# Patient Record
Sex: Male | Born: 1940 | Race: White | Hispanic: No | Marital: Married | Smoking: Former smoker
Health system: Southern US, Community
[De-identification: ages and names within clinical notes are randomized; demographics above are authoritative.]

## PROBLEM LIST (undated history)

## (undated) DIAGNOSIS — C801 Malignant (primary) neoplasm, unspecified: Secondary | ICD-10-CM

## (undated) DIAGNOSIS — R51 Headache: Secondary | ICD-10-CM

## (undated) DIAGNOSIS — N189 Chronic kidney disease, unspecified: Secondary | ICD-10-CM

## (undated) DIAGNOSIS — F0280 Dementia in other diseases classified elsewhere without behavioral disturbance: Secondary | ICD-10-CM

## (undated) DIAGNOSIS — R519 Headache, unspecified: Secondary | ICD-10-CM

## (undated) DIAGNOSIS — I10 Essential (primary) hypertension: Secondary | ICD-10-CM

## (undated) HISTORY — PX: APPENDECTOMY: SHX54

## (undated) HISTORY — DX: Chronic kidney disease, unspecified: N18.9

## (undated) HISTORY — DX: Headache: R51

## (undated) HISTORY — DX: Essential (primary) hypertension: I10

## (undated) HISTORY — DX: Malignant (primary) neoplasm, unspecified: C80.1

## (undated) HISTORY — DX: Headache, unspecified: R51.9

---

## 2010-03-08 ENCOUNTER — Ambulatory Visit: Payer: Self-pay | Admitting: Family Medicine

## 2010-03-08 DIAGNOSIS — I10 Essential (primary) hypertension: Secondary | ICD-10-CM

## 2010-03-08 DIAGNOSIS — L255 Unspecified contact dermatitis due to plants, except food: Secondary | ICD-10-CM | POA: Insufficient documentation

## 2010-08-27 ENCOUNTER — Ambulatory Visit (HOSPITAL_BASED_OUTPATIENT_CLINIC_OR_DEPARTMENT_OTHER): Admission: RE | Admit: 2010-08-27 | Discharge: 2010-08-27 | Payer: Self-pay | Admitting: Podiatry

## 2010-12-25 NOTE — Assessment & Plan Note (Signed)
Summary: RASH/POSSIBLE POSION IVY   Vital Signs:  Patient Profile:   70 Years Old Male CC:      Rash to bilateral lower arms X 4 days Height:     68 inches Weight:      193 pounds O2 Sat:      97 % O2 treatment:    Room Air Temp:     97.5 degrees F oral Pulse rate:   67 / minute Resp:     14 per minute BP sitting:   121 / 81  (left arm) Cuff size:   regular  Pt. in pain?   no  Vitals Entered By: Lajean Saver RN (March 08, 2010 11:39 AM)                   Updated Prior Medication List: HYZAAR 50-12.5 MG TABS (LOSARTAN POTASSIUM-HCTZ) once daily * XYLATAN 1 gtt daily POTASSIUM 99 MG TABS (POTASSIUM) 2 once daily  Current Allergies: ! * LATEX ! * SHELLFISH    History of Present Illness Chief Complaint: Rash to bilateral lower arms X 4 days History of Present Illness: WORKING IN THE WOODS. RASH ON FOREARMS. ITCHES. USING CALAMINE AND CORTISONE CREAM. HX OF PSORIASIS. NO OTHER SYMPTOMS.  REVIEW OF SYSTEMS Constitutional Symptoms      Denies fever, chills, night sweats, weight loss, weight gain, and fatigue.  Eyes       Denies change in vision, eye pain, eye discharge, glasses, contact lenses, and eye surgery. Ear/Nose/Throat/Mouth       Denies hearing loss/aids, change in hearing, ear pain, ear discharge, dizziness, frequent runny nose, frequent nose bleeds, sinus problems, sore throat, hoarseness, and tooth pain or bleeding.  Respiratory       Denies dry cough, productive cough, wheezing, shortness of breath, asthma, bronchitis, and emphysema/COPD.  Cardiovascular       Denies murmurs, chest pain, and tires easily with exhertion.    Gastrointestinal       Denies stomach pain, nausea/vomiting, diarrhea, constipation, blood in bowel movements, and indigestion. Genitourniary       Denies painful urination, kidney stones, and loss of urinary control. Neurological       Denies paralysis, seizures, and fainting/blackouts. Musculoskeletal       Denies muscle pain,  joint pain, joint stiffness, decreased range of motion, redness, swelling, muscle weakness, and gout.  Skin       Denies bruising, unusual mles/lumps or sores, and hair/skin or nail changes.      Comments: bilateral lower arms Psych       Denies mood changes, temper/anger issues, anxiety/stress, speech problems, depression, and sleep problems. Other Comments: Rash to lower arms X 4 days   Past History:  Past Medical History: Hypertension  Past Surgical History: Appendectomy 1948 Cataract extraction right eye  Family History: Mother- DM  Social History: Occupation: Motivational speaker Married Quit smoking 50 years ago after smoking for 6 years Alcohol use-yes 1-2/month Drug use-no Regular exercise-yes Drug Use:  no Does Patient Exercise:  yes Physical Exam General appearance: well developed, well nourished, no acute distress Chest/Lungs: no rales, wheezes, or rhonchi bilateral, breath sounds equal without effort Heart: regular rate and  rhythm, no murmur Skin: RASH CONSISTANT WITH CONTACT DERMATITIS ON FORE ARMS.  Assessment New Problems: CONTACT DERMATITIS&OTHER ECZEMA DUE TO PLANTS (ICD-692.6) HYPERTENSION (ICD-401.9)   Plan New Medications/Changes: MEDROL (PAK) 4 MG TABS (METHYLPREDNISOLONE) TAKE AS DIRECTED WITH FOOD  #1 PK x 0, 03/08/2010, Marvis Moeller DO  New Orders: New Patient  Level II [99202]   Prescriptions: MEDROL (PAK) 4 MG TABS (METHYLPREDNISOLONE) TAKE AS DIRECTED WITH FOOD  #1 PK x 0   Entered and Authorized by:   Marvis Moeller DO   Signed by:   Marvis Moeller DO on 03/08/2010   Method used:   Print then Give to Patient   RxID:   [id number]   Patient Instructions: 1)  CONTINUE CORTISONE AND CALAMINE AS NEEDED. FOLLOW UP WTH YOUR PCP OR RETURN IF SYMPTOMS PERSIST OR WORSEN.

## 2011-02-07 LAB — POCT I-STAT 4, (NA,K, GLUC, HGB,HCT)
Glucose, Bld: 97 mg/dL (ref 70–99)
HCT: 41 % (ref 39.0–52.0)
Sodium: 141 mEq/L (ref 135–145)

## 2011-09-18 NOTE — Op Note (Signed)
  NAMEWILLIARD, Steven             ACCOUNT NO.:  [id number]  MEDICAL RECORD NO.:  [id number]          PATIENT TYPE:  AMB  LOCATION:  [facility]                         FACILITY:  [facility]  PHYSICIAN:  Ezequiel Kayser. Inaaya Vellucci, D.P.M.DATE OF BIRTH:  Nov 21, 1941  DATE OF PROCEDURE: DATE OF DISCHARGE:  08/27/2010                              OPERATIVE REPORT   SURGEON:  Ezequiel Kayser. Myna Freimark, DPM.  ASSISTANT:  None.  PREOPERATIVE DIAGNOSIS:  Chronic plantar fasciitis, right foot.  POSTOPERATIVE DIAGNOSIS:  Chronic plantar fasciitis, right foot.  PROCEDURE:  Plantar fascia micro fasciotomy using radiofrequency coblation.  ANESTHESIA:  LMA.  COMPLICATIONS:  None.  DESCRIPTION OF PROCEDURE:  The patient was brought to the OR and placed in the supine position, at which time LMA anesthesia was administered. A local block was performed with a 1:1 mixture of 0.5% Marcaine plain and 1% lidocaine plain.  The patient was prepped and draped in usual aseptic manner.  The foot was exsanguinated with an Esmarch bandage and a previously applied tourniquet inflated to 250 mmHg.  Attention was directed to the plantar aspect of the foot where the point of pain was identified at the medial calcaneal tuberosity region.  Using a 0.062 smooth K-wire, 20 holes were penetrated into the area in a grade fashion.  Then, using the Topaz-1 set at level 4, a radiofrequency was applied once assistance was felt through the previously made hole. Then, a 2nd application was made deeper.  Thus, 2 applications in each area were performed administering the radiofrequency coblation.  The foot was then dressed with Steri-Strips, Adaptic, 4 x 4's, Kling, and an Ace bandage.  The tourniquet was deflated and vascular status returned to all digits.  The patient was sent to the recovery room with vital signs stable and capillary refill time set at presurgical levels.  The patient was instructed to remain nonweightbearing and a pneumatic  CAM walker was dispensed.  No guarantees given.  All questions answered.          ______________________________ Ezequiel Kayser. Harriet Pho, D.P.M.     MJA/MEDQ  D:  09/11/2010  T:  09/11/2010  Job:  [id number]  Electronically Signed by Larey Dresser D.P.M. on 09/18/2011 05:55:24 PM

## 2011-10-22 ENCOUNTER — Encounter (HOSPITAL_BASED_OUTPATIENT_CLINIC_OR_DEPARTMENT_OTHER)
Admission: RE | Disposition: A | Discharge status: Home / Self Care | Payer: Self-pay | Source: Ambulatory Visit | Attending: Orthopedic Surgery

## 2011-10-22 ENCOUNTER — Ambulatory Visit (HOSPITAL_BASED_OUTPATIENT_CLINIC_OR_DEPARTMENT_OTHER)
Admission: RE | Admit: 2011-10-22 | Discharge: 2011-10-22 | Disposition: A | Discharge status: Home / Self Care | Payer: Medicare Other | Source: Ambulatory Visit | Attending: Orthopedic Surgery | Admitting: Orthopedic Surgery

## 2011-10-22 DIAGNOSIS — Y92009 Unspecified place in unspecified non-institutional (private) residence as the place of occurrence of the external cause: Secondary | ICD-10-CM | POA: Insufficient documentation

## 2011-10-22 DIAGNOSIS — L089 Local infection of the skin and subcutaneous tissue, unspecified: Secondary | ICD-10-CM | POA: Insufficient documentation

## 2011-10-22 DIAGNOSIS — X58XXXA Exposure to other specified factors, initial encounter: Secondary | ICD-10-CM | POA: Insufficient documentation

## 2011-10-22 HISTORY — PX: FOREIGN BODY REMOVAL: SHX962

## 2011-10-22 SURGERY — FOREIGN BODY REMOVAL ADULT
Anesthesia: LOCAL | Laterality: Right

## 2011-10-22 MED ORDER — CIPROFLOXACIN HCL 500 MG PO TABS: 500.0000 mg | ORAL_TABLET | Freq: Two times a day (BID) | ORAL | Status: AC

## 2011-10-22 MED ORDER — LIDOCAINE HCL 2 % IJ SOLN
INTRAMUSCULAR | Status: DC | PRN
  Administered 2011-10-22: 2 mL

## 2011-10-22 SURGICAL SUPPLY — 50 items
BANDAGE ADHESIVE 1X3 (GAUZE/BANDAGES/DRESSINGS) IMPLANT
BANDAGE COBAN STERILE 2 (GAUZE/BANDAGES/DRESSINGS) IMPLANT
BLADE MINI RND TIP GREEN BEAV (BLADE) IMPLANT
BLADE SURG 15 STRL LF DISP TIS (BLADE) ×1 IMPLANT
BLADE SURG 15 STRL SS (BLADE) ×1
BNDG COHESIVE 1X5 TAN STRL LF (GAUZE/BANDAGES/DRESSINGS) ×2 IMPLANT
BNDG ESMARK 4X9 LF (GAUZE/BANDAGES/DRESSINGS) IMPLANT
BRUSH SCRUB EZ PLAIN DRY (MISCELLANEOUS) ×2 IMPLANT
CLOTH BEACON ORANGE TIMEOUT ST (SAFETY) ×2 IMPLANT
CORDS BIPOLAR (ELECTRODE) IMPLANT
COVER MAYO STAND STRL (DRAPES) ×2 IMPLANT
COVER TABLE BACK 60X90 (DRAPES) ×2 IMPLANT
CUFF TOURNIQUET SINGLE 18IN (TOURNIQUET CUFF) IMPLANT
DECANTER SPIKE VIAL GLASS SM (MISCELLANEOUS) IMPLANT
DRAIN PENROSE 1/2X12 LTX STRL (WOUND CARE) IMPLANT
DRAIN PENROSE 1/4X12 LTX STRL (WOUND CARE) IMPLANT
DRAPE EXTREMITY T 121X128X90 (DRAPE) ×2 IMPLANT
DRAPE SURG 17X23 STRL (DRAPES) ×2 IMPLANT
DRSG EMULSION OIL 3X3 NADH (GAUZE/BANDAGES/DRESSINGS) ×2 IMPLANT
GAUZE XEROFORM 1X8 LF (GAUZE/BANDAGES/DRESSINGS) IMPLANT
GLOVE BIOGEL M STRL SZ7.5 (GLOVE) IMPLANT
GLOVE ORTHO TXT STRL SZ7.5 (GLOVE) IMPLANT
GLOVE SKINSENSE NS SZ6.5 (GLOVE) ×2
GLOVE SKINSENSE NS SZ7.5 (GLOVE) ×2
GLOVE SKINSENSE STRL SZ6.5 (GLOVE) ×2 IMPLANT
GLOVE SKINSENSE STRL SZ7.5 (GLOVE) ×2 IMPLANT
GLOVE SURG SS PI 7.0 STRL IVOR (GLOVE) ×2 IMPLANT
GOWN PREVENTION PLUS XLARGE (GOWN DISPOSABLE) ×4 IMPLANT
GOWN PREVENTION PLUS XXLARGE (GOWN DISPOSABLE) ×4 IMPLANT
NEEDLE 27GAX1X1/2 (NEEDLE) ×2 IMPLANT
NEEDLE BLUNT 17GA (NEEDLE) IMPLANT
NS IRRIG 1000ML POUR BTL (IV SOLUTION) IMPLANT
PACK BASIN DAY SURGERY FS (CUSTOM PROCEDURE TRAY) ×2 IMPLANT
PADDING CAST ABS 4INX4YD NS (CAST SUPPLIES) ×1
PADDING CAST ABS COTTON 4X4 ST (CAST SUPPLIES) ×1 IMPLANT
SPONGE GAUZE 2X2 8PLY STRL LF (GAUZE/BANDAGES/DRESSINGS) ×2 IMPLANT
SPONGE GAUZE 4X4 12PLY (GAUZE/BANDAGES/DRESSINGS) ×2 IMPLANT
STOCKINETTE 4X48 STRL (DRAPES) ×2 IMPLANT
SUT CHROMIC 6 0 PS 4 (SUTURE) ×2 IMPLANT
SUT ETHILON 5 0 P 3 18 (SUTURE) ×1
SUT MERSILENE 4 0 P 3 (SUTURE) IMPLANT
SUT NYLON ETHILON 5-0 P-3 1X18 (SUTURE) ×1 IMPLANT
SUT PROLENE 3 0 PS 2 (SUTURE) IMPLANT
SYR 20CC LL (SYRINGE) IMPLANT
SYR 3ML 23GX1 SAFETY (SYRINGE) IMPLANT
SYR CONTROL 10ML LL (SYRINGE) ×2 IMPLANT
TOWEL OR 17X24 6PK STRL BLUE (TOWEL DISPOSABLE) ×4 IMPLANT
TRAY DSU PREP LF (CUSTOM PROCEDURE TRAY) ×2 IMPLANT
UNDERPAD 30X30 INCONTINENT (UNDERPADS AND DIAPERS) ×2 IMPLANT
WATER STERILE IRR 1000ML POUR (IV SOLUTION) ×2 IMPLANT

## 2011-10-22 NOTE — Op Note (Signed)
Dictated 10/22/11 CSN:  [id number] MRN:  [id number] Steven Orozco,Steven Orozco

## 2011-10-22 NOTE — Brief Op Note (Signed)
10/22/2011  1:38 PM  PATIENT:  Steven Orozco  70 y.o. male  PRE-OPERATIVE DIAGNOSIS:  foreign body right index finger  POST-OPERATIVE DIAGNOSIS:  Cedar fragment left index finger  PROCEDURE:  Procedure(s): FOREIGN BODY REMOVAL ADULT middle phalangeal segment of left index finger  SURGEON:  Surgeon(s): Wyn Forster., MD  PHYSICIAN ASSISTANT:   ASSISTANTS: Annye Rusk, P.A.-C   ANESTHESIA:   2% lidocaine 2.5 cc digital block  EBL:     BLOOD ADMINISTERED:none  DRAINS: none   LOCAL MEDICATIONS USED:  XYLOCAINE 2.5 CC  SPECIMEN:  Source of Specimen:  wood foreign body  DISPOSITION OF SPECIMEN:  N/A  COUNTS:  YES  TOURNIQUET:  * Missing tourniquet times found for documented tourniquets in log:  [id number] *  DICTATION: .Other Dictation: Dictation Number [id number]  PLAN OF CARE: Discharge to home after PACU  PATIENT DISPOSITION:  PACU - hemodynamically stable.

## 2011-10-23 ENCOUNTER — Encounter (HOSPITAL_BASED_OUTPATIENT_CLINIC_OR_DEPARTMENT_OTHER): Payer: Self-pay

## 2011-10-23 NOTE — Op Note (Signed)
NAME:  CARVER, MURAKAMI                  ACCOUNT NO.:  MEDICAL RECORD NO.:  [id number]  LOCATION:                                 FACILITY:  PHYSICIAN:  Katy Fitch. Aydon Swamy, M.D.      DATE OF BIRTH:  DATE OF PROCEDURE:  10/22/2011 DATE OF DISCHARGE:                              OPERATIVE REPORT   PREOPERATIVE DIAGNOSIS:  Cedar splinter wood foreign body, left index finger, middle phalangeal segment volar surface.  POSTOPERATIVE DIAGNOSIS:  Cedar splinter wood foreign body, left index finger, middle phalangeal segment volar surface with identification of abscess on the ulnar aspect of middle phalangeal segment, left index finger.  OPERATION:  Removal of Cedar wood foreign body from left index finger, with drainage of abscess.  OPERATING SURGEON:  Katy Fitch. Medina Degraffenreid, MD  ASSISTANT:  Marveen Reeks Dasnoit, PA-C  ANESTHESIA:  Lidocaine 2% metacarpal head level block without sedation, this was a minor operating room procedure.  INDICATIONS:  Ola Fawver is a 70 year old gentleman referred through the courtesy of Dr. Kirby Funk for evaluation and management of a wood foreign body.  He accidentally impaled his finger on October 14, 2011.  There was a neighbor to remove the splinter.  He began to develop swelling and pain.  He is referred for Hand Surgery consult and was advised to pursue incision and drainage for splinter removal and drainage of his abscess.  Questions regarding anticipated procedure were invited and answered in the holding area.  PROCEDURE:  Ashtin Rosner was brought to room 2 at the [facility] and placed in supine position upon the operating table.  Following informed consent and Betadine prep, 2.5 mL of 2% lidocaine were infiltrated at metacarpal head level and in the flexor sheath to obtain a digital block.  After 10 minutes excellent anesthesia was achieved.  The left hand and arm were then prepped with Betadine soap and solution and sterilely  draped.  The finger was then exsanguinated with a gauze wrap and a 0.5 inch Penrose drain placed over the proximal phalangeal segment of the fingers with digital tourniquet.  After routine surgical time-out, procedure commenced with a 1 cm transverse incision directly over the mass.  Subcutaneous tissues were carefully divided, taking care to avoid the positions of the proper digital nerves.  Abscess cavity was identified and this was split and purulent material recovered.  The wooden foreign fragment was then expressed by digital palpation and pressure.  This was removed and shown to Mr. Red.  The wound was then thoroughly explored for other foreign body material and none was identified.  After complete drainage of the abscess, a single 6-0 chromic suture was used to loosely close the wound.  The finger was then dressed with Adaptic, sterile gauze, and Coban anchored at the wrist level.  For aftercare, Mr. Tapp is going to be provided prescription for Cipro 500 mg 1 p.o. b.i.d. x4 days as a prophylactic antibiotic.     Katy Fitch Gustin Zobrist, M.D.     RVS/MEDQ  D:  10/22/2011  T:  10/22/2011  Job:  [id number]  cc:   Thora Lance, M.D.

## 2011-10-24 ENCOUNTER — Encounter (HOSPITAL_BASED_OUTPATIENT_CLINIC_OR_DEPARTMENT_OTHER): Payer: Self-pay | Admitting: Orthopedic Surgery

## 2013-05-06 ENCOUNTER — Other Ambulatory Visit: Payer: Self-pay | Admitting: Internal Medicine

## 2013-05-06 DIAGNOSIS — R413 Other amnesia: Secondary | ICD-10-CM

## 2013-05-08 ENCOUNTER — Ambulatory Visit
Admission: RE | Admit: 2013-05-08 | Discharge: 2013-05-08 | Disposition: A | Discharge status: Home / Self Care | Payer: Medicare Other | Source: Ambulatory Visit | Attending: Internal Medicine | Admitting: Internal Medicine

## 2013-05-08 DIAGNOSIS — R413 Other amnesia: Secondary | ICD-10-CM

## 2013-05-15 ENCOUNTER — Ambulatory Visit
Admission: RE | Admit: 2013-05-15 | Discharge: 2013-05-15 | Disposition: A | Discharge status: Home / Self Care | Payer: Medicare Other | Source: Ambulatory Visit | Attending: Internal Medicine | Admitting: Internal Medicine

## 2013-10-12 ENCOUNTER — Other Ambulatory Visit: Payer: Self-pay | Admitting: Otolaryngology

## 2013-10-12 DIAGNOSIS — R1310 Dysphagia, unspecified: Secondary | ICD-10-CM

## 2013-10-12 DIAGNOSIS — K219 Gastro-esophageal reflux disease without esophagitis: Secondary | ICD-10-CM

## 2013-10-15 ENCOUNTER — Ambulatory Visit
Admission: RE | Admit: 2013-10-15 | Discharge: 2013-10-15 | Disposition: A | Discharge status: Home / Self Care | Payer: Medicare Other | Source: Ambulatory Visit | Attending: Otolaryngology | Admitting: Otolaryngology

## 2013-10-15 DIAGNOSIS — K219 Gastro-esophageal reflux disease without esophagitis: Secondary | ICD-10-CM

## 2013-10-15 DIAGNOSIS — R1310 Dysphagia, unspecified: Secondary | ICD-10-CM

## 2014-11-09 ENCOUNTER — Other Ambulatory Visit: Payer: Self-pay | Admitting: Internal Medicine

## 2014-11-09 DIAGNOSIS — R519 Headache, unspecified: Secondary | ICD-10-CM

## 2014-11-09 DIAGNOSIS — R413 Other amnesia: Secondary | ICD-10-CM

## 2014-11-09 DIAGNOSIS — R51 Headache: Principal | ICD-10-CM

## 2014-11-10 ENCOUNTER — Ambulatory Visit
Admission: RE | Admit: 2014-11-10 | Discharge: 2014-11-10 | Disposition: A | Discharge status: Home / Self Care | Payer: Medicare Other | Source: Ambulatory Visit | Attending: Internal Medicine | Admitting: Internal Medicine

## 2014-11-10 DIAGNOSIS — R519 Headache, unspecified: Secondary | ICD-10-CM

## 2014-11-10 DIAGNOSIS — R413 Other amnesia: Secondary | ICD-10-CM

## 2014-11-10 DIAGNOSIS — R51 Headache: Principal | ICD-10-CM

## 2014-11-10 MED ORDER — IOHEXOL 300 MG/ML  SOLN
75.0000 mL | Freq: Once | INTRAMUSCULAR | Status: AC | PRN
  Administered 2014-11-10: 75 mL via INTRAVENOUS

## 2015-01-17 ENCOUNTER — Other Ambulatory Visit: Payer: Self-pay | Admitting: Internal Medicine

## 2015-01-17 DIAGNOSIS — R1013 Epigastric pain: Secondary | ICD-10-CM

## 2015-01-20 ENCOUNTER — Ambulatory Visit
Admission: RE | Admit: 2015-01-20 | Discharge: 2015-01-20 | Disposition: A | Discharge status: Home / Self Care | Payer: Medicare Other | Source: Ambulatory Visit | Attending: Internal Medicine | Admitting: Internal Medicine

## 2015-01-20 DIAGNOSIS — R1013 Epigastric pain: Secondary | ICD-10-CM

## 2015-01-20 MED ORDER — IOPAMIDOL (ISOVUE-300) INJECTION 61%
100.0000 mL | Freq: Once | INTRAVENOUS | Status: AC | PRN
  Administered 2015-01-20: 100 mL via INTRAVENOUS

## 2015-04-10 ENCOUNTER — Ambulatory Visit
Admission: RE | Admit: 2015-04-10 | Discharge: 2015-04-10 | Disposition: A | Discharge status: Home / Self Care | Payer: Medicare Other | Source: Ambulatory Visit | Attending: Internal Medicine | Admitting: Internal Medicine

## 2015-04-10 ENCOUNTER — Other Ambulatory Visit: Payer: Self-pay | Admitting: Internal Medicine

## 2015-04-10 DIAGNOSIS — M545 Low back pain: Secondary | ICD-10-CM

## 2015-05-15 ENCOUNTER — Encounter: Payer: Self-pay | Admitting: Neurology

## 2015-05-15 ENCOUNTER — Ambulatory Visit (INDEPENDENT_AMBULATORY_CARE_PROVIDER_SITE_OTHER): Payer: Medicare Other | Admitting: Neurology

## 2015-05-15 VITALS — BP 140/78 | HR 78 | Resp 18 | Ht 68.0 in | Wt 175.9 lb

## 2015-05-15 DIAGNOSIS — R51 Headache: Secondary | ICD-10-CM

## 2015-05-15 DIAGNOSIS — G609 Hereditary and idiopathic neuropathy, unspecified: Secondary | ICD-10-CM | POA: Diagnosis not present

## 2015-05-15 DIAGNOSIS — G4486 Cervicogenic headache: Secondary | ICD-10-CM

## 2015-05-15 DIAGNOSIS — R413 Other amnesia: Secondary | ICD-10-CM | POA: Insufficient documentation

## 2015-05-15 NOTE — Patient Instructions (Signed)
1.  We will send you for neuropsychological testing at [facility] 2.  We will get notes from Dr. Krista Blue at [facility] 3.  Try samples of Lyrica 75mg  twice daily for nerve pain. 4.  Limit use of Tylenol to no more than 2 days out of the week 5.  Follow up after neuropsychological testing.

## 2015-05-15 NOTE — Progress Notes (Signed)
NEUROLOGY CONSULTATION NOTE  Steven Orozco MRN: [id number] DOB: December 16, 1940  Referring provider: Dr. Laurann Montana Primary care provider: Dr. Laurann Montana  Reason for consult:  Memory problems, neuropathy, headache  HISTORY OF PRESENT ILLNESS: Steven Orozco is a 74 year old right-handed man with hypertension and neuropathy who presents for memory problems, neuropathy and headache.  Records, labs, MRI of brain and CT of head reviewed.  Memory: He began having memory problems at least 2 years ago.  He notes that he would incorrectly enter appointments on the calendar.  Sometimes, when he drives, he feels confused.  However, he reports examples such as not seeing street signs rather than disorientation on familiar routes.  He sometimes has trouble recalling familiar faces from church, whom he has seen for 3 years.  He has no problems with close friends or family.  He denies problems recognizing people.  He is able to perform all activities of daily living.  His wife is the one who always paid the bills.  He denies depression.  Sleep is variable.  He lives with his wife.  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  He denies family history of dementia.  Neuropathy:  He reports having shooting pain in his right foot about 5-6 years ago following surgery for plantar fasciitis.  He soon developed pain as well as numbness in both feet.  He had a NCV-EMG which reportedly revealed neuropathy.  He tried gabapentin, which caused dizziness.  The pain prevents him from going on walks.  Headache: He started having headaches in December.  On three occasions, he had severe bi-temporal and suboccipital aching headache, lasting no longer than an hour.  He also will have a dull diffuse headache from time to time.  He takes Tylenol several times a week.  TSH was 2.07.  B12 was 1494.    He had an MRI of the brain without contrast performed on 05/15/13, which showed atrophy and chronic small vessel  ischemic changes.  A more recent CT of the head from 11/10/14 looked stable compared to prior MRI.  PAST MEDICAL HISTORY: Past Medical History  Diagnosis Date  . Cancer   . Headache   . Hypertension   . Chronic kidney disease     PAST SURGICAL HISTORY: Past Surgical History  Procedure Laterality Date  . Foreign body removal  10/22/2011    Procedure: FOREIGN BODY REMOVAL ADULT;  Surgeon: Cammie Sickle., MD;  Location: [place];  Service: Orthopedics;  Laterality: Right;  right index   . Appendectomy      MEDICATIONS: No current outpatient prescriptions on file prior to visit.   No current facility-administered medications on file prior to visit.    ALLERGIES: Allergies  Allergen Reactions  . Milk-Related Compounds Itching and Swelling  . Doxycycline Itching    Stomach pain  . Latex Other (See Comments)    Blisters on hands and feet  . Prednisone Other (See Comments)    cramping  . Shellfish-Derived Products Itching    FAMILY HISTORY: Family History  Problem Relation Age of Onset  . Cancer Mother     uterine  . Cancer Sister     breast  . Parkinson's disease Maternal Grandmother     SOCIAL HISTORY: History   Social History  . Marital Status: Married    Spouse Name: N/A  . Number of Children: N/A  . Years of Education: N/A   Occupational History  . Not on file.  Social History Main Topics  . Smoking status: Former Research scientist (life sciences)  . Smokeless tobacco: Never Used  . Alcohol Use: 0.0 oz/week    0 Standard drinks or equivalent per week     Comment: rare  . Drug Use: No  . Sexual Activity: No   Other Topics Concern  . Not on file   Social History Narrative    REVIEW OF SYSTEMS: Constitutional: No fevers, chills, or sweats, no generalized fatigue, change in appetite Eyes: No visual changes, double vision, eye pain Ear, nose and throat: No hearing loss, ear pain, nasal congestion, sore throat Cardiovascular: No chest pain,  palpitations Respiratory:  No shortness of breath at rest or with exertion, wheezes GastrointestinaI: No nausea, vomiting, diarrhea, abdominal pain, fecal incontinence Genitourinary:  No dysuria, urinary retention or frequency Musculoskeletal:  No neck pain, back pain Integumentary: No rash, pruritus, skin lesions Neurological: as above Psychiatric: No depression, insomnia, anxiety Endocrine: No palpitations, fatigue, diaphoresis, mood swings, change in appetite, change in weight, increased thirst Hematologic/Lymphatic:  No anemia, purpura, petechiae. Allergic/Immunologic: no itchy/runny eyes, nasal congestion, recent allergic reactions, rashes  PHYSICAL EXAM: Filed Vitals:   05/15/15 1014  BP: 140/78  Pulse: 78  Resp: 18   General: No acute distress Head:  Normocephalic/atraumatic Eyes:  fundi unremarkable, without vessel changes, exudates, hemorrhages or papilledema. Neck: supple, no paraspinal tenderness, full range of motion Back: No paraspinal tenderness Heart: regular rate and rhythm Lungs: Clear to auscultation bilaterally. Vascular: No carotid bruits. Neurological Exam: Mental status: alert and oriented to person, place, and time, delayed recall poor, remote memory intact, fund of knowledge intact, attention and concentration intact, speech fluent and not dysarthric, language intact. Montreal Cognitive Assessment  05/15/2015  Visuospatial/ Executive (0/5) 2  Naming (0/3) 3  Attention: Read list of digits (0/2) 2  Attention: Read list of letters (0/1) 1  Attention: Serial 7 subtraction starting at 100 (0/3) 3  Language: Repeat phrase (0/2) 2  Language : Fluency (0/1) 1  Abstraction (0/2) 2  Delayed Recall (0/5) 0  Orientation (0/6) 4  Total 20  Adjusted Score (based on education) 20   Cranial nerves: CN I: not tested CN II: pupils equal, round and reactive to light, visual fields intact, fundi unremarkable, without vessel changes, exudates, hemorrhages or  papilledema. CN III, IV, VI:  full range of motion, no nystagmus, no ptosis CN V: facial sensation intact CN VII: upper and lower face symmetric CN VIII: hearing intact CN IX, X: gag intact, uvula midline CN XI: sternocleidomastoid and trapezius muscles intact CN XII: tongue midline Bulk & Tone: normal, no fasciculations. Motor:  5/5 throughout Sensation:  Pinprick and vibration intact Deep Tendon Reflexes:  2+ throughout, toes downgoing Finger to nose testing:  No dysmetria Heel to shin:  No dysmetria Gait:  Mildly cautious gait.  Able to turn and tandem walk. Romberg negative.  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 Possible cervicogenic headache.  PLAN: Refer for neuropsychological testing Lyrica 75mg  twice daily Limit use of pain relievers (Tylenol) to no more than 2 days out of the week Follow up after testing. Have blood pressure rechecked by PCP  Thank you for allowing me to take part in the care of this patient.  Metta Clines, DO  CC:  Lavone Orn, MD

## 2015-05-30 ENCOUNTER — Ambulatory Visit: Payer: Medicare Other | Admitting: Neurology

## 2015-06-21 ENCOUNTER — Telehealth: Payer: Self-pay | Admitting: Neurology

## 2015-06-21 NOTE — Telephone Encounter (Signed)
Terry from [phone]/ needs to know what they are to do for Steven Orozco/DOB: [birth year]

## 2015-06-21 NOTE — Telephone Encounter (Signed)
I spoke with  Steven Orozco this patient is already seeing [facility] as requested no referral was sent to Behavioral health

## 2015-07-11 ENCOUNTER — Ambulatory Visit (INDEPENDENT_AMBULATORY_CARE_PROVIDER_SITE_OTHER): Payer: Medicare Other | Admitting: Neurology

## 2015-07-11 ENCOUNTER — Encounter: Payer: Self-pay | Admitting: Neurology

## 2015-07-11 VITALS — BP 134/82 | HR 64 | Resp 16 | Ht 68.5 in | Wt 176.4 lb

## 2015-07-11 DIAGNOSIS — I679 Cerebrovascular disease, unspecified: Secondary | ICD-10-CM | POA: Diagnosis not present

## 2015-07-11 DIAGNOSIS — G3184 Mild cognitive impairment, so stated: Secondary | ICD-10-CM | POA: Insufficient documentation

## 2015-07-11 DIAGNOSIS — G609 Hereditary and idiopathic neuropathy, unspecified: Secondary | ICD-10-CM

## 2015-07-11 MED ORDER — DONEPEZIL HCL 5 MG PO TABS: 5.0000 mg | ORAL_TABLET | Freq: Every day | ORAL | Status: DC

## 2015-07-11 NOTE — Progress Notes (Signed)
NEUROLOGY FOLLOW UP OFFICE NOTE  Steven Orozco [id number]  HISTORY OF PRESENT ILLNESS: Steven Orozco is a 74 year old right-handed man with hypertension and neuropathy and former smoker who presents for memory problems, neuropathy and headache.  Neurocognitive testing and assessment reviewed.  He is accompanied by his wife, who provides some history.  MEMORY: He began having memory problems at least 2 years ago.  He notes that he would incorrectly enter appointments on the calendar.  Sometimes, when he drives, he feels confused.  However, he reports examples such as not seeing street signs rather than disorientation on familiar routes.  He sometimes has trouble recalling familiar faces from church, whom he has seen for 3 years.  He has no problems with close friends or family.  He denies problems recognizing people.  He is able to perform all activities of daily living.  His wife is the one who always paid the bills.  He denies depression.  Sleep is variable.  He lives with his wife.  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  He denies family history of dementia.  Update:  He underwent neurocognitive testing on 06/19/15, which revealed multi-domain mild cognitive impairment, as demonstrated by deficits in processing speed, executive function, memory retrieval, and visual form discrimination.  Etiology is likely cerebrovascular.  He also exhibited adjustment disorder with mixed anxiety and mild depressed mood.  Neuropathy:   He reports having shooting pain in his right foot about 5-6 years ago following surgery for plantar fasciitis.  He soon developed pain as well as numbness in both feet.  He had a NCV-EMG which reportedly revealed neuropathy.  He tried gabapentin, which caused dizziness.  The pain prevents him from going on walks.  Headache: He started having headaches in December.  On three occasions, he had severe bi-temporal and suboccipital aching headache, lasting no  longer than an hour.  He also will have a dull diffuse headache from time to time.  He takes Tylenol several times a week.  TSH was 2.07.  B12 was 1494.    He had an MRI of the brain without contrast performed on 05/15/13, which showed atrophy and chronic small vessel ischemic changes.  A more recent CT of the head from 11/10/14 looked stable compared to prior MRI.  PAST MEDICAL HISTORY: Past Medical History  Diagnosis Date  . Cancer   . Headache   . Hypertension   . Chronic kidney disease     MEDICATIONS: Current Outpatient Prescriptions on File Prior to Visit  Medication Sig Dispense Refill  . acetaminophen (TYLENOL) 325 MG tablet Take 650 mg by mouth every 6 (six) hours as needed.    . latanoprost (XALATAN) 0.005 % ophthalmic solution INSTILL 1 DROP INTO BOTH EYES EVERY NIGHT AT BEDTIME  5   No current facility-administered medications on file prior to visit.    ALLERGIES: Allergies  Allergen Reactions  . Milk-Related Compounds Itching and Swelling  . Doxycycline Itching    Stomach pain  . Latex Other (See Comments) and Dermatitis    Blisters on hands and feet  . Prednisone Other (See Comments)    cramping  . Shellfish-Derived Products Itching    FAMILY HISTORY: Family History  Problem Relation Age of Onset  . Cancer Mother     uterine  . Cancer Sister     breast  . Parkinson's disease Maternal Grandmother     SOCIAL HISTORY: Social History   Social History  . Marital Status:  Married    Spouse Name: N/A  . Number of Children: N/A  . Years of Education: N/A   Occupational History  . Not on file.   Social History Main Topics  . Smoking status: Former Research scientist (life sciences)  . Smokeless tobacco: Never Used  . Alcohol Use: 0.0 oz/week    0 Standard drinks or equivalent per week     Comment: rare  . Drug Use: No  . Sexual Activity: No   Other Topics Concern  . Not on file   Social History Narrative    REVIEW OF SYSTEMS: Constitutional: No fevers, chills, or  sweats, no generalized fatigue, change in appetite Eyes: No visual changes, double vision, eye pain Ear, nose and throat: No hearing loss, ear pain, nasal congestion, sore throat Cardiovascular: No chest pain, palpitations Respiratory:  No shortness of breath at rest or with exertion, wheezes GastrointestinaI: No nausea, vomiting, diarrhea, abdominal pain, fecal incontinence Genitourinary:  No dysuria, urinary retention or frequency Musculoskeletal:  No neck pain, back pain Integumentary: No rash, pruritus, skin lesions Neurological: as above Psychiatric: No depression, insomnia, anxiety Endocrine: No palpitations, fatigue, diaphoresis, mood swings, change in appetite, change in weight, increased thirst Hematologic/Lymphatic:  No anemia, purpura, petechiae. Allergic/Immunologic: no itchy/runny eyes, nasal congestion, recent allergic reactions, rashes  PHYSICAL EXAM: Filed Vitals:   07/11/15 0944  BP: 134/82  Pulse: 64  Resp: 16   General: No acute distress.  Patient appears well-groomed.   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  He previously was on a statin but stopped due to possible side effects Results raised concerns regarding is ability to safely operate a motor vehicle.  Therefore, he should undergo formal driving assessment.  He should not drive until test assessment.  At this point, he says he will let his wife do all the driving. Discussed possibility of starting an SSRI, but he declines. Lyrica 75mg  twice daily  27 minutes spent face to face with patient, 100% spent discussing neurocognitive testing results and  management.  Metta Clines, DO  CC:  Lavone Orn, MD

## 2015-07-11 NOTE — Patient Instructions (Signed)
1.  Start aspirin 81mg  daily 2.  We will start donepezil (Aricept) 5mg  daily for four weeks.  If you are tolerating the medication, then after four weeks, we will increase the dose to 10mg  daily.  Side effects include nausea, vomiting, diarrhea, vivid dreams, and muscle cramps.  Please call the clinic if you experience any of these symptoms. 3.  No driving 4.  Follow up in 6 months.

## 2015-07-14 ENCOUNTER — Other Ambulatory Visit: Payer: Self-pay | Admitting: Internal Medicine

## 2015-07-14 DIAGNOSIS — M545 Low back pain: Secondary | ICD-10-CM

## 2015-07-18 ENCOUNTER — Other Ambulatory Visit: Payer: Medicare Other

## 2015-07-19 ENCOUNTER — Other Ambulatory Visit: Payer: Medicare Other

## 2015-07-21 ENCOUNTER — Ambulatory Visit
Admission: RE | Admit: 2015-07-21 | Discharge: 2015-07-21 | Disposition: A | Discharge status: Home / Self Care | Payer: Medicare Other | Source: Ambulatory Visit | Attending: Internal Medicine | Admitting: Internal Medicine

## 2015-07-21 DIAGNOSIS — M545 Low back pain: Secondary | ICD-10-CM

## 2015-08-07 ENCOUNTER — Other Ambulatory Visit: Payer: Self-pay | Admitting: Neurology

## 2015-08-11 ENCOUNTER — Telehealth: Payer: Self-pay | Admitting: Neurology

## 2015-08-11 NOTE — Telephone Encounter (Signed)
I would have him stop the Aricept immediately.  With the dark stool, one must consider GI bleed.  I want him to monitor his stool and if it is still dark by Monday or Tuesday, then he should contact his PCP.  If he becomes weak or lightheaded during the weekend, he should probably be seen in the ED.

## 2015-08-11 NOTE — Telephone Encounter (Signed)
Patient made aware of advise from Dr Tomi Likens. He agrees with this plan. He will call us about possible change in medication once these symptoms are over.

## 2015-08-11 NOTE — Telephone Encounter (Signed)
Pt states that he is on dohepezil 5 mg and it is causing some side effects such as dark stools muscle cramps and upset stomach please call [phone]

## 2015-08-11 NOTE — Telephone Encounter (Signed)
Spoke with Steven Orozco. He states that aricept is the only new medication he has started and is having dark stools, muscle cramps and upset stomach after he takes the medication. I told him I would ask for your opinion but, to not take anymore of the medication until he hears back from our office.

## 2015-11-14 ENCOUNTER — Other Ambulatory Visit: Payer: Self-pay | Admitting: Gastroenterology

## 2015-11-14 DIAGNOSIS — R1013 Epigastric pain: Secondary | ICD-10-CM

## 2015-11-21 ENCOUNTER — Ambulatory Visit
Admission: RE | Admit: 2015-11-21 | Discharge: 2015-11-21 | Disposition: A | Discharge status: Home / Self Care | Payer: Medicare Other | Source: Ambulatory Visit | Attending: Gastroenterology | Admitting: Gastroenterology

## 2015-11-21 DIAGNOSIS — R1013 Epigastric pain: Secondary | ICD-10-CM

## 2015-11-23 ENCOUNTER — Other Ambulatory Visit: Payer: Medicare Other

## 2016-01-12 ENCOUNTER — Ambulatory Visit: Payer: Medicare Other | Admitting: Neurology

## 2016-01-15 ENCOUNTER — Ambulatory Visit (INDEPENDENT_AMBULATORY_CARE_PROVIDER_SITE_OTHER): Payer: Medicare Other | Admitting: Neurology

## 2016-01-15 ENCOUNTER — Encounter: Payer: Self-pay | Admitting: Neurology

## 2016-01-15 VITALS — BP 136/78 | HR 82 | Ht 68.5 in | Wt 173.0 lb

## 2016-01-15 DIAGNOSIS — G44219 Episodic tension-type headache, not intractable: Secondary | ICD-10-CM

## 2016-01-15 DIAGNOSIS — I10 Essential (primary) hypertension: Secondary | ICD-10-CM

## 2016-01-15 DIAGNOSIS — G609 Hereditary and idiopathic neuropathy, unspecified: Secondary | ICD-10-CM

## 2016-01-15 DIAGNOSIS — I679 Cerebrovascular disease, unspecified: Secondary | ICD-10-CM

## 2016-01-15 DIAGNOSIS — G3184 Mild cognitive impairment, so stated: Secondary | ICD-10-CM

## 2016-01-15 DIAGNOSIS — R0789 Other chest pain: Secondary | ICD-10-CM

## 2016-01-15 MED ORDER — DULOXETINE HCL 30 MG PO CPEP: 30.0000 mg | ORAL_CAPSULE | Freq: Every day | ORAL | Status: DC

## 2016-01-15 MED ORDER — TIZANIDINE HCL 2 MG PO TABS: 2.0000 mg | ORAL_TABLET | Freq: Three times a day (TID) | ORAL | Status: DC | PRN

## 2016-01-15 MED ORDER — GABAPENTIN 100 MG PO CAPS: ORAL_CAPSULE | ORAL | Status: DC

## 2016-01-15 NOTE — Progress Notes (Signed)
Chart forwarded.  

## 2016-01-15 NOTE — Progress Notes (Addendum)
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  History obtained by patient and his wife.  Neuropsych evaluation reviewed.  MEMORY: Update:  he was started on Aricept, as well as ASA 81mg  daily.  In September, he reported increased abdominal pain and dark stool.  He was advised to stop both Aricept and ASA and be checked out for upper GI bleed.  He reportedly saw his gastroenterologist.  I do not have his notes, but apparently he did CT and MRI and bleed was ruled out.  He now reports that the stool wasn't really dark but more "orange".  He still has abdominal discomfort occasionally.  He no longer drives as his wife reports concerns with depth perception and slowed reaction time.  He is able to perform his activities of daily living.  History:  He began having memory problems at least 2 years ago.  He notes that he would incorrectly enter appointments on the calendar.  Sometimes, when he drives, he feels confused.  However, he reports examples such as not seeing street signs rather than disorientation on familiar routes.  He sometimes has trouble recalling familiar faces from church, whom he has seen for 3 years.  He has no problems with close friends or family.  He denies problems recognizing people.  He is able to perform all activities of daily living.  His wife is the one who always paid the bills.  He denies depression.  Sleep is variable.  He lives with his wife.  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  He denies family history of dementia. He underwent neurocognitive testing on 06/19/15, which revealed multi-domain mild cognitive impairment, as demonstrated by deficits in processing speed, executive function, memory retrieval, and visual form discrimination.  Etiology is likely  cerebrovascular.  He also exhibited adjustment disorder with mixed anxiety and mild depressed mood.  Neuropathy:   Update:  he was started on Lyrica 75mg  twice daily.  He was given samples but never contacted [place] for refills.  He still has pain in the feet but back pain has improved with seeing a chiropractor.  History:  He reports having shooting pain in his right foot about 5-6 years ago following surgery for plantar fasciitis.  He soon developed pain as well as numbness in both feet.  He had a NCV-EMG which reportedly revealed neuropathy.  He tried gabapentin, which caused dizziness.  The pain prevents him from going on walks.  TSH and B12 were normal.    Headache: He started having headaches at the end of 2015.  On three occasions, he had severe bi-temporal and suboccipital aching headache, lasting no longer than an hour.  He also will have a dull diffuse headache from time to time.  He still has temporal headaches without nausea, photophobia or phonophobia, about one to three times a week.  He takes Tylenol.  He is unable to tolerate NSAIDs.  He had an MRI of the brain without contrast performed on 05/15/13, which showed atrophy and chronic small vessel ischemic changes.  A more recent CT of the head from 11/10/14 looked stable compared to prior MRI.  He also reports chest pain for past couple of weeks.  PAST MEDICAL HISTORY: Past Medical History  Diagnosis Date  . Cancer ([place])   . Headache   .  Hypertension   . Chronic kidney disease     MEDICATIONS: Current Outpatient Prescriptions on File Prior to Visit  Medication Sig Dispense Refill  . acetaminophen (TYLENOL) 325 MG tablet Take 650 mg by mouth every 6 (six) hours as needed.    . latanoprost (XALATAN) 0.005 % ophthalmic solution INSTILL 1 DROP INTO BOTH EYES EVERY NIGHT AT BEDTIME  5  . donepezil (ARICEPT) 5 MG tablet TAKE 1 TABLET BY MOUTH AT BEDTIME (Patient not taking: Reported on 01/15/2016) 30 tablet 5   No current  facility-administered medications on file prior to visit.    ALLERGIES: Allergies  Allergen Reactions  . Milk-Related Compounds Itching and Swelling  . Doxycycline Itching    Stomach pain  . Latex Other (See Comments) and Dermatitis    Blisters on hands and feet  . Prednisone Other (See Comments)    cramping  . Shellfish-Derived Products Itching    FAMILY HISTORY: Family History  Problem Relation Age of Onset  . Cancer Mother     uterine  . Cancer Sister     breast  . Parkinson's disease Maternal Grandmother     SOCIAL HISTORY: Social History   Social History  . Marital Status: Married    Spouse Name: N/A  . Number of Children: N/A  . Years of Education: N/A   Occupational History  . Not on file.   Social History Main Topics  . Smoking status: Former Research scientist (life sciences)  . Smokeless tobacco: Never Used  . Alcohol Use: 0.0 oz/week    0 Standard drinks or equivalent per week     Comment: rare  . Drug Use: No  . Sexual Activity: No   Other Topics Concern  . Not on file   Social History Narrative   Ph.D.    REVIEW OF SYSTEMS: Constitutional: No fevers, chills, or sweats, no generalized fatigue, change in appetite Eyes: No visual changes, double vision, eye pain Ear, nose and throat: No hearing loss, ear pain, nasal congestion, sore throat Cardiovascular: No palpitations.  Some chest discomfort Respiratory:  No shortness of breath at rest or with exertion, wheezes GastrointestinaI: No nausea, vomiting, diarrhea, abdominal pain, fecal incontinence Genitourinary:  No dysuria, urinary retention or frequency Musculoskeletal:  No neck pain, back pain Integumentary: No rash, pruritus, skin lesions Neurological: as above Psychiatric: No depression, insomnia, anxiety Endocrine: No palpitations, fatigue, diaphoresis, mood swings, change in appetite, change in weight, increased thirst Hematologic/Lymphatic:  No anemia, purpura, petechiae. Allergic/Immunologic: no itchy/runny  eyes, nasal congestion, recent allergic reactions, rashes  PHYSICAL EXAM: Filed Vitals:   01/15/16 0930  BP: 136/78  Pulse: 82   General: No acute distress.  Patient appears well-groomed.  normal body habitus. Head:  Normocephalic/atraumatic Eyes:  Fundoscopic exam unremarkable without vessel changes, exudates, hemorrhages or papilledema. Neck: supple, no paraspinal tenderness, full range of motion Heart:  Regular rate and rhythm Lungs:  Clear to auscultation bilaterally Back: No paraspinal tenderness Neurological Exam: alert and oriented to person, place, and time. Attention span and concentration intact, recent and remote memory intact, fund of knowledge intact.  Speech fluent and not dysarthric, language intact.   Montreal Cognitive Assessment  01/15/2016 05/15/2015  Visuospatial/ Executive (0/5) 0 2  Naming (0/3) 3 3  Attention: Read list of digits (0/2) 2 2  Attention: Read list of letters (0/1) 1 1  Attention: Serial 7 subtraction starting at 100 (0/3) 2 3  Language: Repeat phrase (0/2) 2 2  Language : Fluency (0/1) 1 1  Abstraction (0/2) 2 2  Delayed Recall (0/5) 0 0  Orientation (0/6) 2 4  Total 15 20  Adjusted Score (based on education) 15 20    CN II-XII intact. Fundoscopic exam unremarkable without vessel changes, exudates, hemorrhages or papilledema.  Bulk and tone normal, muscle strength 5/5 throughout.  Sensation to light touch, temperature and vibration intact.  Deep tendon reflexes 2+ throughout, toes downgoing.  Finger to nose and heel to shin testing intact.  Gait normal, Romberg negative.  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  Will obtain notes from GI to verify no evidence of GI bleed.  If so, then would try restarting Aricept and ASA 81mg  daily  ADDENDUM:  I reviewed the notes from his gastroenterologist.  EGD and colonscopy  revealed no GI bleed.  Will restart Aricept and ASA 81mg  daily. 2.  For headache and nerve pain, will start Cymbalta 30mg  daily. 3.  Advised to contact Dr. Laurann Montana ASAP regarding chest discomfort 4.  Continue BP control 5.  F/u 7 mos  26 minutes spent face to face with patient, over 50% spent discussing diagnoses and management.   Metta Clines, DO  CC:  Lavone Orn, MD

## 2016-01-15 NOTE — Patient Instructions (Signed)
For headaches, start Cymbalta (duloxetine) 30mg  daily Take tizanidine 2mg  every 8 hours as needed for headache.  First, take at bedtime only to see how it makes you feel.  Caution for dizziness. Will get notes from GI doctor Follow up in 6 months.

## 2016-01-22 ENCOUNTER — Telehealth: Payer: Self-pay

## 2016-01-22 NOTE — Telephone Encounter (Signed)
I reviewed notes from GI. He did not have a GI bleed. Therefore   1. Recommend restarting Aricept: 5mg  at bedtime for 4 weeks. If tolerating, he should call and we can increase to 10mg  at bedtime.   2. Restart ASA 81mg  daily    - Staff message from Dr. Tomi Likens

## 2016-01-22 NOTE — Telephone Encounter (Signed)
Left message on machine for pt to return call to the office.  

## 2016-04-01 ENCOUNTER — Other Ambulatory Visit: Payer: Self-pay | Admitting: Neurology

## 2016-04-02 NOTE — Telephone Encounter (Signed)
Last OV: 01/15/16 Next OV: 07/15/16

## 2016-07-15 ENCOUNTER — Encounter: Payer: Self-pay | Admitting: Neurology

## 2016-07-15 ENCOUNTER — Ambulatory Visit (INDEPENDENT_AMBULATORY_CARE_PROVIDER_SITE_OTHER): Payer: Medicare Other | Admitting: Neurology

## 2016-07-15 VITALS — BP 128/84 | HR 82 | Ht 68.5 in | Wt 175.0 lb

## 2016-07-15 DIAGNOSIS — I10 Essential (primary) hypertension: Secondary | ICD-10-CM

## 2016-07-15 DIAGNOSIS — I679 Cerebrovascular disease, unspecified: Secondary | ICD-10-CM | POA: Diagnosis not present

## 2016-07-15 MED ORDER — DONEPEZIL HCL 10 MG PO TABS: 10.0000 mg | ORAL_TABLET | Freq: Every day | ORAL | 5 refills | Status: DC

## 2016-07-15 NOTE — Progress Notes (Signed)
Chart forwarded.  

## 2016-07-15 NOTE — Patient Instructions (Signed)
1.  We will increase donepezil to 10mg  at bedtime 2.  Recommend Mediterranean diet    Why follow it? Research shows. . Those who follow the Mediterranean diet have a reduced risk of heart disease  . The diet is associated with a reduced incidence of Parkinson's and Alzheimer's diseases . People following the diet may have longer life expectancies and lower rates of chronic diseases  . The Dietary Guidelines for Americans recommends the Mediterranean diet as an eating plan to promote health and prevent disease  What Is the Mediterranean Diet?  . Healthy eating plan based on typical foods and recipes of Mediterranean-style cooking . The diet is primarily a plant based diet; these foods should make up a majority of meals   Starches - Plant based foods should make up a majority of meals - They are an important sources of vitamins, minerals, energy, antioxidants, and fiber - Choose whole grains, foods high in fiber and minimally processed items  - Typical grain sources include wheat, oats, barley, corn, brown rice, bulgar, farro, millet, polenta, couscous  - Various types of beans include chickpeas, lentils, fava beans, black beans, white beans   Fruits  Veggies - Large quantities of antioxidant rich fruits & veggies; 6 or more servings  - Vegetables can be eaten raw or lightly drizzled with oil and cooked  - Vegetables common to the traditional Mediterranean Diet include: artichokes, arugula, beets, broccoli, brussel sprouts, cabbage, carrots, celery, collard greens, cucumbers, eggplant, kale, leeks, lemons, lettuce, mushrooms, okra, onions, peas, peppers, potatoes, pumpkin, radishes, rutabaga, shallots, spinach, sweet potatoes, turnips, zucchini - Fruits common to the Mediterranean Diet include: apples, apricots, avocados, cherries, clementines, dates, figs, grapefruits, grapes, melons, nectarines, oranges, peaches, pears, pomegranates, strawberries, tangerines  Fats - Replace butter and margarine  with healthy oils, such as olive oil, canola oil, and tahini  - Limit nuts to no more than a handful a day  - Nuts include walnuts, almonds, pecans, pistachios, pine nuts  - Limit or avoid candied, honey roasted or heavily salted nuts - Olives are central to the [employer] - can be eaten whole or used in a variety of dishes   Meats Protein - Limiting red meat: no more than a few times a month - When eating red meat: choose lean cuts and keep the portion to the size of deck of cards - Eggs: approx. 0 to 4 times a week  - Fish and lean poultry: at least 2 a week  - Healthy protein sources include, chicken, [place], lean beef, lamb - Increase intake of seafood such as tuna, salmon, trout, mackerel, shrimp, scallops - Avoid or limit high fat processed meats such as sausage and bacon  Dairy - Include moderate amounts of low fat dairy products  - Focus on healthy dairy such as fat free yogurt, skim milk, low or reduced fat cheese - Limit dairy products higher in fat such as whole or 2% milk, cheese, ice cream  Alcohol - Moderate amounts of red wine is ok  - No more than 5 oz daily for women (all ages) and men older than age 37  - No more than 10 oz of wine daily for men younger than 55  Other - Limit sweets and other desserts  - Use herbs and spices instead of salt to flavor foods  - Herbs and spices common to the traditional Mediterranean Diet include: basil, bay leaves, chives, cloves, cumin, fennel, garlic, lavender, marjoram, mint, oregano, parsley, pepper, rosemary, sage, savory, sumac,  tarragon, thyme   It's not just a diet, it's a lifestyle:  . The Mediterranean diet includes lifestyle factors typical of those in the region  . Foods, drinks and meals are best eaten with others and savored . Daily physical activity is important for overall good health . This could be strenuous exercise like running and aerobics . This could also be more leisurely activities such as walking,  housework, yard-work, or taking the stairs . Moderation is the key; a balanced and healthy diet accommodates most foods and drinks . Consider portion sizes and frequency of consumption of certain foods   Meal Ideas & Options:  . Breakfast:  o Whole wheat toast or whole wheat English muffins with peanut butter & hard boiled egg o Steel cut oats topped with apples & cinnamon and skim milk  o Fresh fruit: banana, strawberries, melon, berries, peaches  o Smoothies: strawberries, bananas, greek yogurt, peanut butter o Low fat greek yogurt with blueberries and granola  o Egg white omelet with spinach and mushrooms o Breakfast couscous: whole wheat couscous, apricots, skim milk, cranberries  . Sandwiches:  o Hummus and grilled vegetables (peppers, zucchini, squash) on whole wheat bread   o Grilled chicken on whole wheat pita with lettuce, tomatoes, cucumbers or tzatziki  o Tuna salad on whole wheat bread: tuna salad made with greek yogurt, olives, red peppers, capers, green onions o Garlic rosemary lamb pita: lamb sauted with garlic, rosemary, salt & pepper; add lettuce, cucumber, greek yogurt to pita - flavor with lemon juice and black pepper  . Seafood:  o Mediterranean grilled salmon, seasoned with garlic, basil, parsley, lemon juice and black pepper o Shrimp, lemon, and spinach whole-grain pasta salad made with low fat greek yogurt  o Seared scallops with lemon orzo  o Seared tuna steaks seasoned salt, pepper, coriander topped with tomato mixture of olives, tomatoes, olive oil, minced garlic, parsley, green onions and cappers  . Meats:  o Herbed greek chicken salad with kalamata olives, cucumber, feta  o Red bell peppers stuffed with spinach, bulgur, lean ground beef (or lentils) & topped with feta   o Kebabs: skewers of chicken, tomatoes, onions, zucchini, squash  o [place] burgers: made with red onions, mint, dill, lemon juice, feta cheese topped with roasted red  peppers . Vegetarian o Cucumber salad: cucumbers, artichoke hearts, celery, red onion, feta cheese, tossed in olive oil & lemon juice  o Hummus and whole grain pita points with a greek salad (lettuce, tomato, feta, olives, cucumbers, red onion) o Lentil soup with celery, carrots made with vegetable broth, garlic, salt and pepper  o Tabouli salad: parsley, bulgur, mint, scallions, cucumbers, tomato, radishes, lemon juice, olive oil, salt and pepper. 4.  Continue aspirin 81mg  daily 5.  Schedule neuropsychological testing in November 6.  Follow up afterwards.

## 2016-07-15 NOTE — Progress Notes (Signed)
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  History obtained by patient and his wife, who accompanies him.   UPDATE: He was started on Aricept, as well as ASA 81mg  daily. The Aricept was never increased to 10mg .  He stopped Cymbalta.  Headaches and nerve pain have been controlled.  He has had some progression in memory problems.  He forgets names of friends.  He no longer drives.   Memory: History:  He began having memory problems at least 2 years ago.  He notes that he would incorrectly enter appointments on the calendar.  Sometimes, when he drives, he feels confused.  However, he reports examples such as not seeing street signs rather than disorientation on familiar routes.  He sometimes has trouble recalling familiar faces from church, whom he has seen for 3 years.  He has no problems with close friends or family.  He denies problems recognizing people.  He is able to perform all activities of daily living.  His wife is the one who always paid the bills.  He denies depression.  Sleep is variable.  He lives with his wife.  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  He denies family history of dementia. He underwent neurocognitive testing on 06/19/15, which revealed multi-domain mild cognitive impairment, as demonstrated by deficits in processing speed, executive function, memory retrieval, and visual form discrimination.  Etiology is likely cerebrovascular.  He also exhibited adjustment disorder with mixed anxiety and mild depressed mood.   Neuropathy:   Update:  he was started on Lyrica 75mg  twice daily and Cymbalta 30mg    History:  He reports having shooting pain in his right foot about 5-6 years ago following surgery for plantar fasciitis.  He soon developed pain as well as numbness  in both feet.  He had a NCV-EMG which reportedly revealed neuropathy.  He tried gabapentin, which caused dizziness.  The pain prevents him from going on walks.  TSH and B12 were normal.     Headache: Update:  He is taking Cymbalta 30mg   History:  He started having headaches at the end of 2015.  On three occasions, he had severe bi-temporal and suboccipital aching headache, lasting no longer than an hour.  He also will have a dull diffuse headache from time to time.  He still has temporal headaches without nausea, photophobia or phonophobia, about one to three times a week.  He takes Tylenol.  He is unable to tolerate NSAIDs.   He had an MRI of the brain without contrast performed on 05/15/13, which showed atrophy and chronic small vessel ischemic changes.  A more recent CT of the head from 11/10/14 looked stable compared to prior MRI.  PAST MEDICAL HISTORY: Past Medical History:  Diagnosis Date  . Cancer ([place])   . Chronic kidney disease   . Headache   . Hypertension     MEDICATIONS: Current Outpatient Prescriptions on File Prior to Visit  Medication Sig Dispense Refill  . acetaminophen (TYLENOL) 325 MG tablet Take 650 mg by mouth every 6 (six) hours as needed.    . Cholecalciferol (D3 ADULT PO) Take 1,000 Units by mouth.    . latanoprost (XALATAN) 0.005 % ophthalmic solution INSTILL 1 DROP INTO BOTH EYES EVERY NIGHT AT BEDTIME  5  . Multiple Vitamins-Minerals (PRESERVISION AREDS  PO) Take by mouth.    . Probiotic Product (PROBIOTIC + OMEGA-3 PO) Take by mouth.     No current facility-administered medications on file prior to visit.     ALLERGIES: Allergies  Allergen Reactions  . Milk-Related Compounds Itching and Swelling  . Doxycycline Itching    Stomach pain  . Latex Other (See Comments) and Dermatitis    Blisters on hands and feet  . Prednisone Other (See Comments)    cramping  . Shellfish-Derived Products Itching    FAMILY HISTORY: Family History  Problem Relation Age  of Onset  . Cancer Mother     uterine  . Cancer Sister     breast  . Parkinson's disease Maternal Grandmother     SOCIAL HISTORY: Social History   Social History  . Marital status: Married    Spouse name: N/A  . Number of children: N/A  . Years of education: N/A   Occupational History  . Not on file.   Social History Main Topics  . Smoking status: Former Research scientist (life sciences)  . Smokeless tobacco: Never Used  . Alcohol use 0.0 oz/week     Comment: rare  . Drug use: No  . Sexual activity: No   Other Topics Concern  . Not on file   Social History Narrative   Ph.D.    REVIEW OF SYSTEMS: Constitutional: No fevers, chills, or sweats, no generalized fatigue, change in appetite Eyes: No visual changes, double vision, eye pain Ear, nose and throat: No hearing loss, ear pain, nasal congestion, sore throat Cardiovascular: No chest pain, palpitations Respiratory:  No shortness of breath at rest or with exertion, wheezes GastrointestinaI: No nausea, vomiting, diarrhea, abdominal pain, fecal incontinence Genitourinary:  No dysuria, urinary retention or frequency Musculoskeletal:  No neck pain, back pain Integumentary: No rash, pruritus, skin lesions Neurological: as above Psychiatric: No depression, insomnia, anxiety Endocrine: No palpitations, fatigue, diaphoresis, mood swings, change in appetite, change in weight, increased thirst Hematologic/Lymphatic:  No purpura, petechiae. Allergic/Immunologic: no itchy/runny eyes, nasal congestion, recent allergic reactions, rashes  PHYSICAL EXAM: Vitals:   07/15/16 1102  BP: 128/84  Pulse: 82   General: No acute distress.  Patient appears well-groomed.  normal body habitus. Head:  Normocephalic/atraumatic Eyes:  Fundi examined but not visualized Neck: supple, no paraspinal tenderness, full range of motion Heart:  Regular rate and rhythm Lungs:  Clear to auscultation bilaterally Back: No paraspinal tenderness Neurological Exam: alert and  oriented to person, place, and time. Attention span and concentration intact, recent and remote memory intact, fund of knowledge intact.  Speech fluent and not dysarthric, language intact.  CN II-XII intact. Bulk and tone normal, muscle strength 5/5 throughout.  Sensation to light touch, temperature and vibration intact.  Deep tendon reflexes 2+ throughout, toes downgoing.  Finger to nose and heel to shin testing intact.  Gait normal, Romberg negative.  IMPRESSION: Cognitive impairment, likely vascular  PLAN: 1.  Increase Aricept to 10mg  at bedtime 2.  Repeat neuropsych testing 3.  Mediterranean diet, reading about new topics, continue daily walks 4.  Follow up after repeat testing 5.  ASA 81mg  daily.  Blood pressure control  29 minutes spent face to face with patient, over 50% spent counseling.  Metta Clines, DO  CC:  Lavone Orn, MD

## 2016-08-07 ENCOUNTER — Telehealth: Payer: Self-pay | Admitting: Neurology

## 2016-08-07 MED ORDER — DONEPEZIL HCL 10 MG PO TABS: 10.0000 mg | ORAL_TABLET | Freq: Every day | ORAL | 3 refills | Status: DC

## 2016-08-07 NOTE — Telephone Encounter (Signed)
Prescription was sent to pharmacy. 

## 2016-08-07 NOTE — Telephone Encounter (Signed)
[facility] called in regards to a prescription for PT/Dawn CB# [phone]

## 2016-08-29 ENCOUNTER — Telehealth: Payer: Self-pay | Admitting: Neurology

## 2016-08-29 NOTE — Telephone Encounter (Signed)
Steven Orozco  Patient wife called and left message on voice mail that patient is having stomach problems and would like to talk to someone please call [phone]

## 2016-08-29 NOTE — Telephone Encounter (Signed)
Stomach issues ongoing for a while (Possibly a year per wife) They have been seeing PCP. Has had multiple tests with no answers. Has been eating a bland diet to see if issue was in diet. Still no relief. Now believe that it may be due to Aricept. Would like to know if there is another medication they can switch to? Or a trial off medication to see if it is the culprit. Please advise.

## 2016-08-29 NOTE — Telephone Encounter (Signed)
I would have him just stop the Aricept and see if he improves over next few days.  If he is improved, they should contact us next week and we can try an alternative medication

## 2016-08-30 NOTE — Telephone Encounter (Signed)
Message relayed to patient. Verbalized understanding and denied questions.   

## 2016-09-26 ENCOUNTER — Encounter: Payer: Self-pay | Admitting: Psychology

## 2016-09-26 ENCOUNTER — Ambulatory Visit (INDEPENDENT_AMBULATORY_CARE_PROVIDER_SITE_OTHER): Payer: Medicare Other | Admitting: Psychology

## 2016-09-26 DIAGNOSIS — G3184 Mild cognitive impairment, so stated: Secondary | ICD-10-CM

## 2016-09-26 DIAGNOSIS — R413 Other amnesia: Secondary | ICD-10-CM | POA: Diagnosis not present

## 2016-09-26 DIAGNOSIS — I679 Cerebrovascular disease, unspecified: Secondary | ICD-10-CM
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
## 2016-09-30 ENCOUNTER — Ambulatory Visit (INDEPENDENT_AMBULATORY_CARE_PROVIDER_SITE_OTHER): Payer: Medicare Other | Admitting: Psychology

## 2016-09-30 DIAGNOSIS — I679 Cerebrovascular disease, unspecified: Secondary | ICD-10-CM

## 2016-09-30 DIAGNOSIS — R413 Other amnesia: Secondary | ICD-10-CM

## 2016-09-30 NOTE — Progress Notes (Signed)
   Neuropsychology Note  Steven Orozco returned today for 2 hours of neuropsychological testing with technician, Milana Kidney, BS, under the supervision of Dr. Macarthur Critchley. The patient did not appear overtly distressed by the testing session, per behavioral observation or via self-report to the technician. Rest breaks were offered. Steven Orozco will return within 2 weeks for a feedback session with Dr. Si Raider at which time his test performances, clinical impressions and treatment recommendations will be reviewed in detail. The patient understands he can contact our office should he require our assistance before this time.  Full report to follow.
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
## 2016-10-07 ENCOUNTER — Ambulatory Visit (INDEPENDENT_AMBULATORY_CARE_PROVIDER_SITE_OTHER): Payer: Medicare Other | Admitting: Psychology

## 2016-10-07 ENCOUNTER — Encounter: Payer: Self-pay | Admitting: Psychology

## 2016-10-07 DIAGNOSIS — F0280 Dementia in other diseases classified elsewhere without behavioral disturbance: Secondary | ICD-10-CM

## 2016-10-07 DIAGNOSIS — G301 Alzheimer's disease with late onset: Secondary | ICD-10-CM

## 2016-10-07 NOTE — Patient Instructions (Signed)
Based on the findings of the present evaluation, the following recommendations are offered:  1. Continue assistance with complex ADLs. Oversight of medication is recommended to make sure it is taken correctly. 2. Continue Aricept. 3. Continue to engage in social activities and physical exercise. Mental stimulation is also important; this can be as simple as engaging in puzzles, art projects and listening to music. A regular routine/schedule will likely be beneficial.  4. I agree with Dr. Tomi Likens that the [place] seems to have the most empirical support for preventing AD; there is no conclusive research that I know of regarding dietary recommendations to delay progression of already diagnosed AD. 5. PoA, living will and estate planning should be completed (if they haven't already). 6. Caregiver support and education are available through the Alzheimer's Association ([employer]) and Tax adviser.

## 2016-10-15 ENCOUNTER — Telehealth: Payer: Self-pay | Admitting: Psychology

## 2016-10-15 NOTE — Progress Notes (Signed)
Returned patient's wife's call. Nunzio Cory had general questions about Alzheimer's disease and dementia which I answered. Support was provided. She was encouraged to look at the [place] website as well as consider attending a local caregiver support group.

## 2016-10-15 NOTE — Telephone Encounter (Signed)
-----   Message from [facility] sent at 10/15/2016  8:56 AM EST ----- PT's wife Nunzio Cory called and wanted to talk to you about PT, she wants you to only talk t her/Dawn CB# [phone]

## 2016-12-31 ENCOUNTER — Other Ambulatory Visit: Payer: Self-pay

## 2016-12-31 MED ORDER — DONEPEZIL HCL 10 MG PO TABS: 10.0000 mg | ORAL_TABLET | Freq: Every day | ORAL | 3 refills | Status: DC

## 2017-02-14 ENCOUNTER — Ambulatory Visit (HOSPITAL_COMMUNITY): Payer: Medicare Other | Attending: Cardiology

## 2017-02-14 ENCOUNTER — Other Ambulatory Visit: Payer: Self-pay

## 2017-02-14 ENCOUNTER — Other Ambulatory Visit: Payer: Self-pay | Admitting: Internal Medicine

## 2017-02-14 DIAGNOSIS — R011 Cardiac murmur, unspecified: Secondary | ICD-10-CM

## 2017-02-14 DIAGNOSIS — I071 Rheumatic tricuspid insufficiency: Secondary | ICD-10-CM | POA: Insufficient documentation

## 2017-02-14 DIAGNOSIS — I700 Atherosclerosis of aorta: Secondary | ICD-10-CM | POA: Insufficient documentation

## 2017-08-26 IMAGING — MR MR LUMBAR SPINE W/O CM
4 of 5 series · 18 of 48 positions shown · non-contrast
Comparison: Plain films lumbar spine 04/10/2015 and CT abdomen and
pelvis 01/20/2015.

CLINICAL DATA: Mid back pain since a long distance car trip 8
months ago. No known injury.

EXAM:
MRI LUMBAR SPINE WITHOUT CONTRAST
TECHNIQUE: Multiplanar, multisequence MR imaging of the lumbar spine was
performed. No intravenous contrast was administered.

[Series 2: T1 · sagittal · 4.0mm · 0.51mm/px · 3 of 12 slices shown (1 of 2)]
[im 3/12]
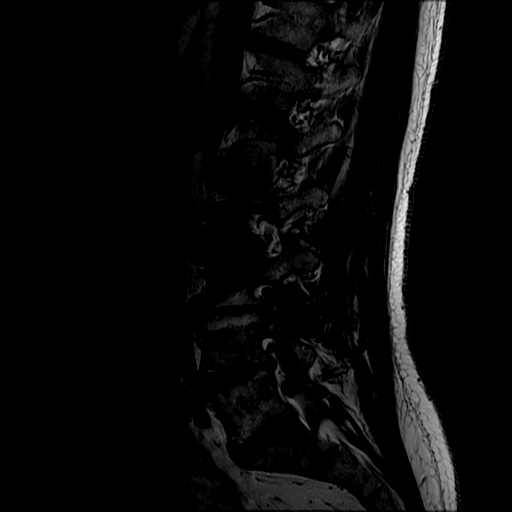
[im 7/12]
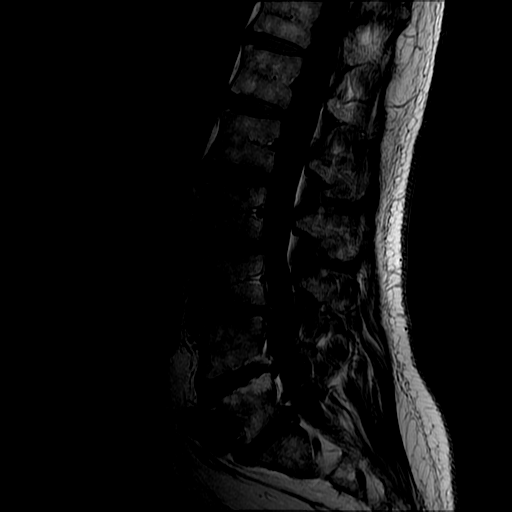
[im 12/12]
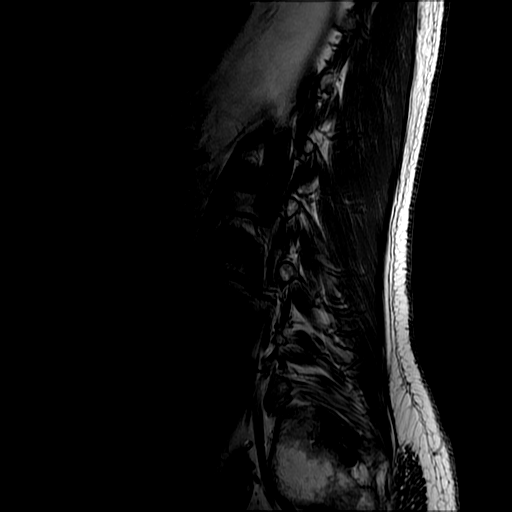

[Series 3: T2 · sagittal · 4.0mm · 0.51mm/px · 6 of 12 slices shown (1 of 2)]
[im 1/12]
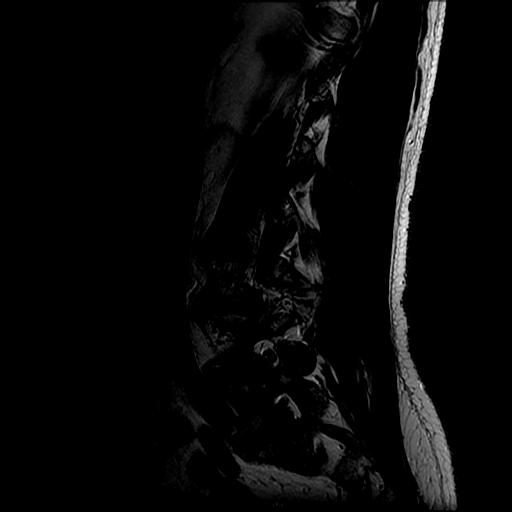
[im 3/12]
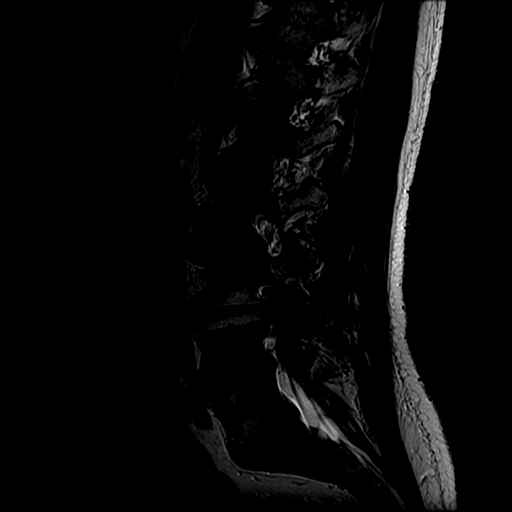
[im 5/12]
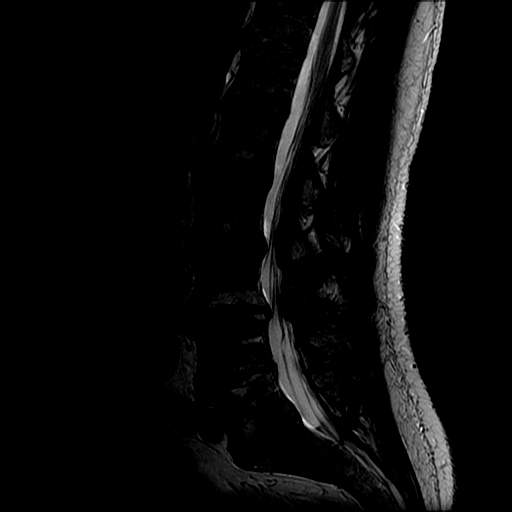
[im 7/12]
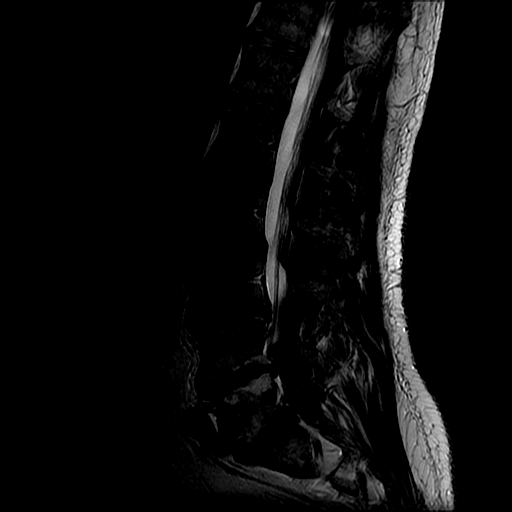
[im 9/12]
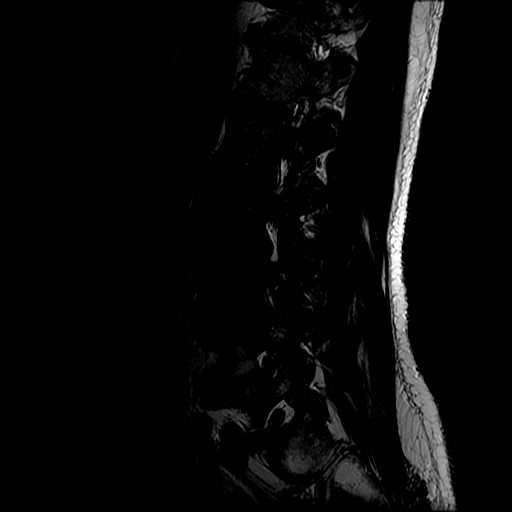
[im 12/12]
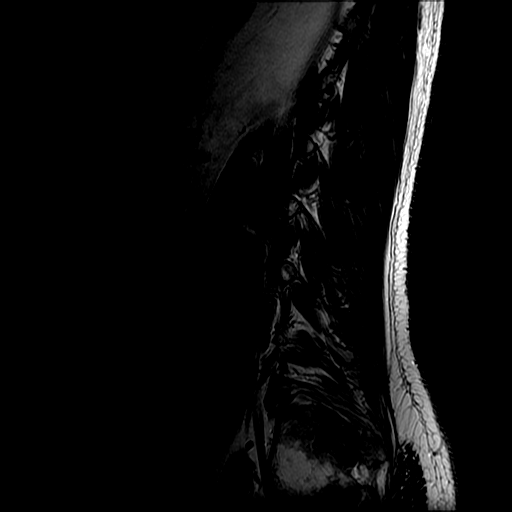

[Series 5: T2 · axial · 4.0mm · 0.39mm/px · z∈[-185,-25]mm · 6 of 32 slices shown (2 of 2)]
[im 1/32]
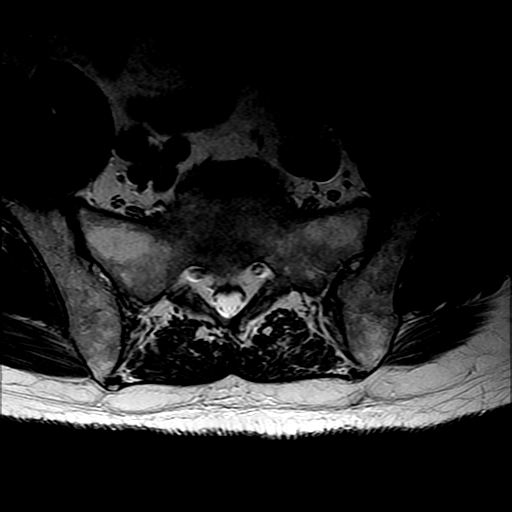
[im 5/32]
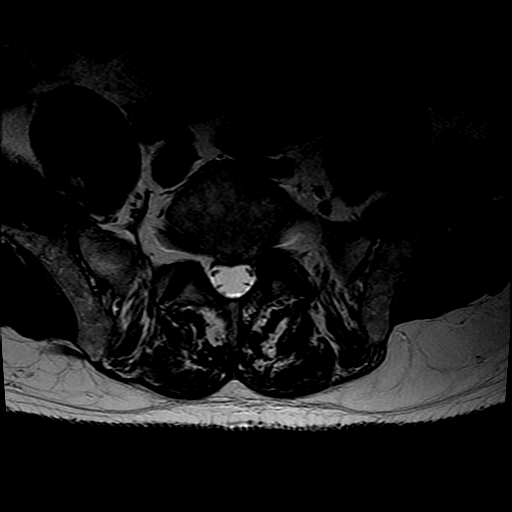
[im 9/32]
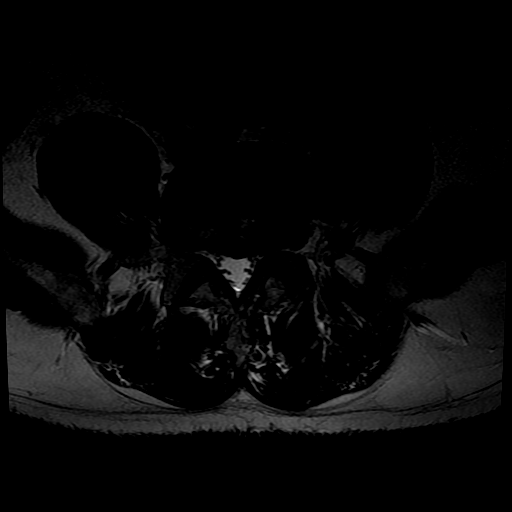
[im 14/32]
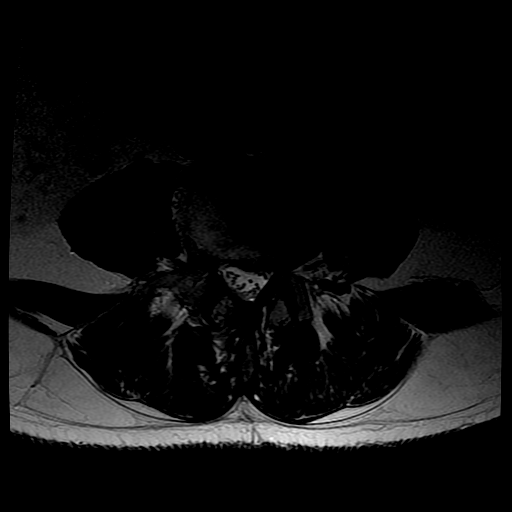
[im 16/32]
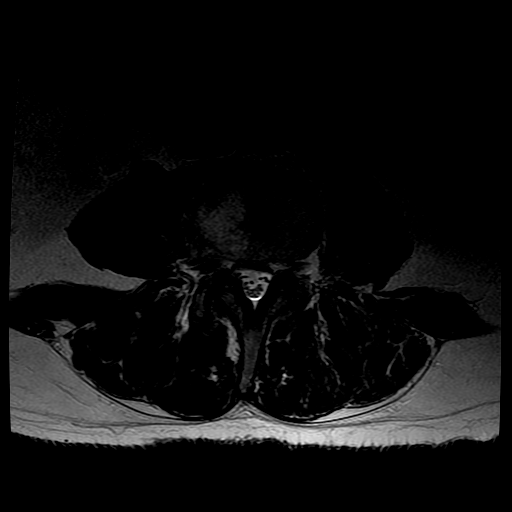
[im 27/32]
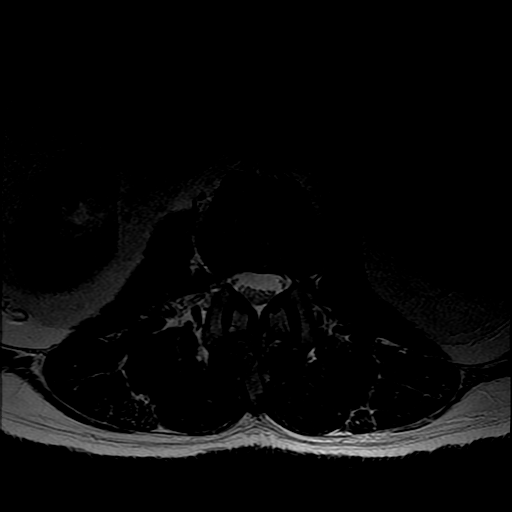

[Series 6: T1 · axial · 4.0mm · 0.39mm/px · z∈[-166,-25]mm · 3 of 32 slices shown (2 of 2)]
[im 5/32]
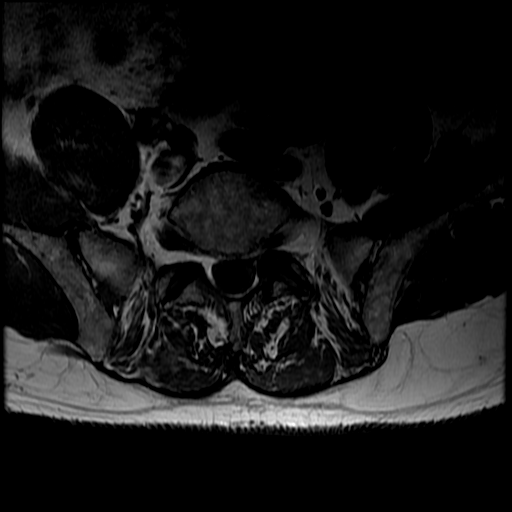
[im 16/32]
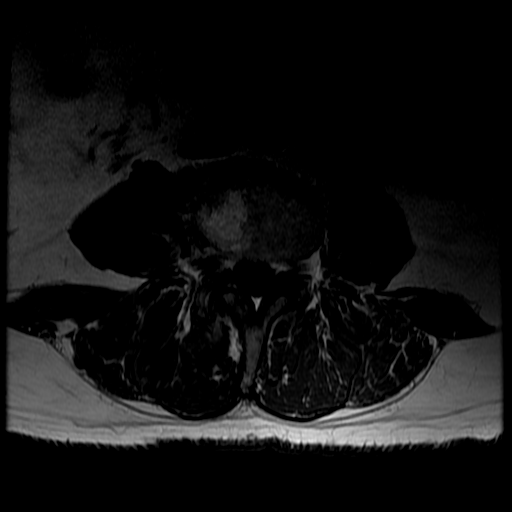
[im 27/32]
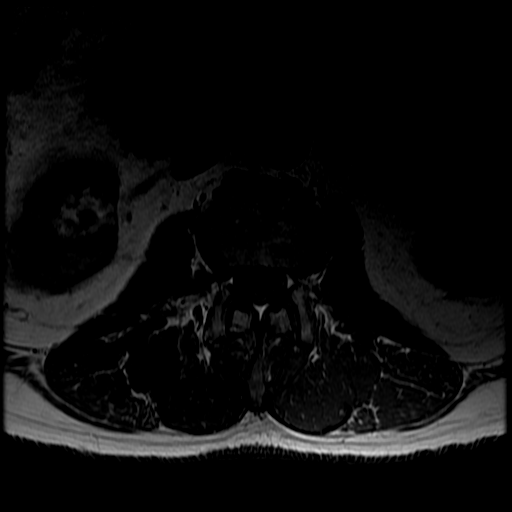

[18 of 48 positions shown; findings below may reference images not displayed]

FINDINGS: Vertebral body height and alignment maintained. Degenerative
endplate signal change is most notable at L4-5. No worrisome marrow
lesion is seen. The conus medullaris is normal in signal and
position. Right renal cysts are identified as on the prior CT scan.
Imaged intra-abdominal contents are otherwise unremarkable.

The T11-12 and T12-L1 levels are imaged in the sagittal plane only
and negative.

L1-2:  Negative.

L2-3: Moderate facet arthropathy with some ligamentum flavum
thickening and a shallow disc bulge. The central canal is mildly
narrowed. The foramina are open.

L3-4: There is a broad-based disc bulge with some ligamentum flavum
thickening and facet arthropathy. Moderate central canal narrowing
is seen. Mild to moderate foraminal narrowing appears worse on the
right.

L4-5: Left worse than right facet degenerative disease identified
and there is a shallow disc bulge with endplate spur. The central
canal and right foramen are open. Moderate left foraminal narrowing
is seen.

L5-S1: There is some facet degenerative change and a minimal disc
bulge. The central spinal canal and foramina are open.
IMPRESSION: Mild to moderate central canal narrowing L3-4 due to a shallow disc
bulge and ligamentum flavum thickening. Mild to moderate foraminal
narrowing at this level appears worse on the left.

Mild central canal narrowing L2-3 due to a shallow disc bulge and
ligamentum flavum thickening. Facet degenerative change is noted at
this level.

Moderate left foraminal narrowing L4-5 due to disc and facet
degenerative disease.

## 2018-02-02 ENCOUNTER — Other Ambulatory Visit: Payer: Self-pay | Admitting: Neurology

## 2018-05-05 ENCOUNTER — Other Ambulatory Visit: Payer: Self-pay | Admitting: Neurology

## 2018-07-28 ENCOUNTER — Other Ambulatory Visit: Payer: Self-pay | Admitting: Neurology

## 2019-04-04 ENCOUNTER — Emergency Department (HOSPITAL_COMMUNITY): Payer: Medicare Other

## 2019-04-04 ENCOUNTER — Inpatient Hospital Stay (HOSPITAL_COMMUNITY)
Admission: EM | Admit: 2019-04-04 | Discharge: 2019-04-12 | DRG: 689 | Disposition: A | Discharge status: Skilled Nursing Facility | Payer: Medicare Other | Attending: Internal Medicine | Admitting: Internal Medicine

## 2019-04-04 ENCOUNTER — Other Ambulatory Visit: Payer: Self-pay

## 2019-04-04 ENCOUNTER — Encounter (HOSPITAL_COMMUNITY): Payer: Self-pay | Admitting: Student

## 2019-04-04 DIAGNOSIS — Z87891 Personal history of nicotine dependence: Secondary | ICD-10-CM

## 2019-04-04 DIAGNOSIS — G309 Alzheimer's disease, unspecified: Secondary | ICD-10-CM | POA: Diagnosis present

## 2019-04-04 DIAGNOSIS — L89312 Pressure ulcer of right buttock, stage 2: Secondary | ICD-10-CM | POA: Diagnosis present

## 2019-04-04 DIAGNOSIS — G9341 Metabolic encephalopathy: Secondary | ICD-10-CM | POA: Diagnosis present

## 2019-04-04 DIAGNOSIS — N390 Urinary tract infection, site not specified: Secondary | ICD-10-CM | POA: Diagnosis present

## 2019-04-04 DIAGNOSIS — Z91040 Latex allergy status: Secondary | ICD-10-CM | POA: Diagnosis not present

## 2019-04-04 DIAGNOSIS — I129 Hypertensive chronic kidney disease with stage 1 through stage 4 chronic kidney disease, or unspecified chronic kidney disease: Secondary | ICD-10-CM | POA: Diagnosis present

## 2019-04-04 DIAGNOSIS — Z20828 Contact with and (suspected) exposure to other viral communicable diseases: Secondary | ICD-10-CM | POA: Diagnosis present

## 2019-04-04 DIAGNOSIS — Z808 Family history of malignant neoplasm of other organs or systems: Secondary | ICD-10-CM | POA: Diagnosis not present

## 2019-04-04 DIAGNOSIS — L89890 Pressure ulcer of other site, unstageable: Secondary | ICD-10-CM | POA: Diagnosis present

## 2019-04-04 DIAGNOSIS — L03116 Cellulitis of left lower limb: Secondary | ICD-10-CM | POA: Diagnosis present

## 2019-04-04 DIAGNOSIS — Z803 Family history of malignant neoplasm of breast: Secondary | ICD-10-CM | POA: Diagnosis not present

## 2019-04-04 DIAGNOSIS — B9620 Unspecified Escherichia coli [E. coli] as the cause of diseases classified elsewhere: Secondary | ICD-10-CM | POA: Diagnosis present

## 2019-04-04 DIAGNOSIS — F0280 Dementia in other diseases classified elsewhere without behavioral disturbance: Secondary | ICD-10-CM | POA: Diagnosis present

## 2019-04-04 DIAGNOSIS — Z8673 Personal history of transient ischemic attack (TIA), and cerebral infarction without residual deficits: Secondary | ICD-10-CM

## 2019-04-04 DIAGNOSIS — N3000 Acute cystitis without hematuria: Principal | ICD-10-CM | POA: Diagnosis present

## 2019-04-04 DIAGNOSIS — Z91011 Allergy to milk products: Secondary | ICD-10-CM

## 2019-04-04 DIAGNOSIS — Z820 Family history of epilepsy and other diseases of the nervous system: Secondary | ICD-10-CM

## 2019-04-04 DIAGNOSIS — I10 Essential (primary) hypertension: Secondary | ICD-10-CM | POA: Diagnosis present

## 2019-04-04 DIAGNOSIS — I679 Cerebrovascular disease, unspecified: Secondary | ICD-10-CM | POA: Diagnosis not present

## 2019-04-04 DIAGNOSIS — Z91013 Allergy to seafood: Secondary | ICD-10-CM

## 2019-04-04 DIAGNOSIS — N183 Chronic kidney disease, stage 3 (moderate): Secondary | ICD-10-CM | POA: Diagnosis present

## 2019-04-04 DIAGNOSIS — E876 Hypokalemia: Secondary | ICD-10-CM | POA: Diagnosis not present

## 2019-04-04 DIAGNOSIS — Z881 Allergy status to other antibiotic agents status: Secondary | ICD-10-CM

## 2019-04-04 DIAGNOSIS — R4182 Altered mental status, unspecified: Secondary | ICD-10-CM | POA: Diagnosis not present

## 2019-04-04 DIAGNOSIS — L89322 Pressure ulcer of left buttock, stage 2: Secondary | ICD-10-CM | POA: Diagnosis present

## 2019-04-04 DIAGNOSIS — Z888 Allergy status to other drugs, medicaments and biological substances status: Secondary | ICD-10-CM | POA: Diagnosis not present

## 2019-04-04 DIAGNOSIS — G3184 Mild cognitive impairment, so stated: Secondary | ICD-10-CM | POA: Diagnosis not present

## 2019-04-04 HISTORY — DX: Dementia in other diseases classified elsewhere, unspecified severity, without behavioral disturbance, psychotic disturbance, mood disturbance, and anxiety: F02.80

## 2019-04-04 LAB — COMPREHENSIVE METABOLIC PANEL
ALT: 15 U/L (ref 0–44)
AST: 21 U/L (ref 15–41)
Albumin: 4.1 g/dL (ref 3.5–5.0)
Alkaline Phosphatase: 64 U/L (ref 38–126)
Anion gap: 10 (ref 5–15)
BUN: 13 mg/dL (ref 8–23)
CO2: 25 mmol/L (ref 22–32)
Calcium: 9.2 mg/dL (ref 8.9–10.3)
Chloride: 100 mmol/L (ref 98–111)
Creatinine, Ser: 1.01 mg/dL (ref 0.61–1.24)
GFR calc Af Amer: >60 mL/min (ref >60)
GFR calc non Af Amer: >60 mL/min (ref >60)
Glucose, Bld: 124 mg/dL — ABNORMAL HIGH (ref 70–99)
Potassium: 4.1 mmol/L (ref 3.5–5.1)
Sodium: 135 mmol/L (ref 135–145)
Total Bilirubin: 1.2 mg/dL (ref 0.3–1.2)
Total Protein: 6.8 g/dL (ref 6.5–8.1)

## 2019-04-04 LAB — SARS CORONAVIRUS 2 BY RT PCR (HOSPITAL ORDER, PERFORMED IN ~~LOC~~ HOSPITAL LAB): SARS Coronavirus 2: NEGATIVE

## 2019-04-04 LAB — CBC WITH DIFFERENTIAL/PLATELET
Abs Immature Granulocytes: 0.03 10*3/uL (ref 0.00–0.07)
Basophils Absolute: 0 10*3/uL (ref 0.0–0.1)
Basophils Relative: 0 %
Eosinophils Absolute: 0.3 10*3/uL (ref 0.0–0.5)
Eosinophils Relative: 2 %
HCT: 40.8 % (ref 39.0–52.0)
Hemoglobin: 13.4 g/dL (ref 13.0–17.0)
Immature Granulocytes: 0 %
Lymphocytes Relative: 8 %
Lymphs Abs: 1.1 10*3/uL (ref 0.7–4.0)
MCH: 30.8 pg (ref 26.0–34.0)
MCHC: 32.8 g/dL (ref 30.0–36.0)
MCV: 93.8 fL (ref 80.0–100.0)
Monocytes Absolute: 1.1 10*3/uL — ABNORMAL HIGH (ref 0.1–1.0)
Monocytes Relative: 8 %
Neutro Abs: 11.6 10*3/uL — ABNORMAL HIGH (ref 1.7–7.7)
Neutrophils Relative %: 82 %
Platelets: 143 10*3/uL — ABNORMAL LOW (ref 150–400)
RBC: 4.35 MIL/uL (ref 4.22–5.81)
RDW: 12 % (ref 11.5–15.5)
WBC: 14.2 10*3/uL — ABNORMAL HIGH (ref 4.0–10.5)
nRBC: 0 % (ref 0.0–0.2)

## 2019-04-04 LAB — URINALYSIS, COMPLETE (UACMP) WITH MICROSCOPIC
Bilirubin Urine: NEGATIVE
Glucose, UA: NEGATIVE mg/dL
Hgb urine dipstick: NEGATIVE
Ketones, ur: NEGATIVE mg/dL
Leukocytes,Ua: NEGATIVE
Nitrite: POSITIVE — AB
Protein, ur: 30 mg/dL — AB
Specific Gravity, Urine: 1.023 (ref 1.005–1.030)
pH: 7 (ref 5.0–8.0)

## 2019-04-04 LAB — LACTIC ACID, PLASMA: Lactic Acid, Venous: 3.6 mmol/L (ref 0.5–1.9)

## 2019-04-04 LAB — CBG MONITORING, ED: Glucose-Capillary: 106 mg/dL — ABNORMAL HIGH (ref 70–99)

## 2019-04-04 MED ORDER — SODIUM CHLORIDE 0.9 % IV SOLN: 2.0000 g | Freq: Once | INTRAVENOUS | Status: DC

## 2019-04-04 MED ORDER — HALOPERIDOL LACTATE 5 MG/ML IJ SOLN
5.0000 mg | Freq: Once | INTRAMUSCULAR | Status: AC
  Administered 2019-04-04: 23:00:00 5 mg via INTRAVENOUS
  Filled 2019-04-04: qty 1

## 2019-04-04 MED ORDER — DEXTROSE-NACL 5-0.45 % IV SOLN
INTRAVENOUS | Status: DC
  Administered 2019-04-04 – 2019-04-06 (×3): via INTRAVENOUS

## 2019-04-04 MED ORDER — ACETAMINOPHEN 650 MG RE SUPP
650.0000 mg | Freq: Once | RECTAL | Status: AC
  Administered 2019-04-04: 17:00:00 650 mg via RECTAL
  Filled 2019-04-04: qty 1

## 2019-04-04 MED ORDER — ONDANSETRON HCL 4 MG PO TABS: 4.0000 mg | ORAL_TABLET | Freq: Four times a day (QID) | ORAL | Status: DC | PRN

## 2019-04-04 MED ORDER — SODIUM CHLORIDE 0.9 % IV SOLN
1.0000 g | Freq: Once | INTRAVENOUS | Status: AC
  Administered 2019-04-04: 19:00:00 1 g via INTRAVENOUS
  Filled 2019-04-04: qty 10

## 2019-04-04 MED ORDER — LORAZEPAM 2 MG/ML IJ SOLN
0.5000 mg | Freq: Once | INTRAMUSCULAR | Status: AC
  Administered 2019-04-04: 18:00:00 0.5 mg via INTRAVENOUS
  Filled 2019-04-04: qty 1

## 2019-04-04 MED ORDER — SODIUM CHLORIDE 0.9 % IV SOLN
1.0000 g | INTRAVENOUS | Status: DC
  Administered 2019-04-05: 18:00:00 1 g via INTRAVENOUS
  Filled 2019-04-04: qty 10

## 2019-04-04 MED ORDER — SODIUM CHLORIDE 0.9 % IV BOLUS
1000.0000 mL | Freq: Once | INTRAVENOUS | Status: AC
  Administered 2019-04-04: 1000 mL via INTRAVENOUS

## 2019-04-04 MED ORDER — ONDANSETRON HCL 4 MG/2ML IJ SOLN: 4.0000 mg | Freq: Four times a day (QID) | INTRAMUSCULAR | Status: DC | PRN

## 2019-04-04 MED ORDER — ENOXAPARIN SODIUM 40 MG/0.4ML ~~LOC~~ SOLN
40.0000 mg | SUBCUTANEOUS | Status: DC
  Administered 2019-04-04 – 2019-04-11 (×6): 40 mg via SUBCUTANEOUS
  Filled 2019-04-04 (×8): qty 0.4

## 2019-04-04 NOTE — ED Notes (Signed)
This RN called to check on room status and spoke to [employer] who informed this RN that room is currently being cleaned, RN to call when clean.  Charge RN Santiago Glad notified of update.

## 2019-04-04 NOTE — H&P (Signed)
History and Physical   Steven Orozco MVE:[id number] DOB: 1941-01-30 DOA: 04/04/2019  Referring MD/NP/PA: Dr Jeanell Sparrow  PCP: Lavone Orn, MD   Outpatient Specialists: None  Patient coming from: Home  Chief Complaint: Altered mental status  HPI: Steven Orozco is a 78 y.o. male with medical history significant of Alzheimer's dementia, hypertension, chronic kidney disease stage III, who was brought in to the ER with worsening mental status and agitation.  Patient lives with his wife via the phone.  He was usually conversational and not agitated but today appears to have changed.  He is noted gradual decline in the last 2 days.  He was not talking and feels and appears very weak.  No falls no injury.  No sick contact.  He had recently had left foot cellulitis and was on Keflex that has improved but this appears to be coming back now.  No shortness of breath or any respiratory symptoms.  Patient was brought into the ER where he was evaluated and found to have UTI.  He is being admitted with acute metabolic encephalopathy most likely due to UTI...  ED Course: Temperature is 100.9 blood pressure 120/78 pulse 95 respiratory of 30 oxygen sat 93% room air.  His white count is 14.2 platelet 143 hemoglobin 13.4.  Chemistry appears to be within normal.  Lactic acid 3.6.  Nitrite is positive with WBC 0-5 many bacteria RBC 21-50.  Chest x-ray showed no active disease.  Blood and urine cultures obtained and patient is being admitted with UTI leading to metabolic encephalopathy.  Review of Systems: As per HPI otherwise 10 point review of systems negative.    Past Medical History:  Diagnosis Date  . Alzheimer's dementia ([place])   . Cancer ([place])   . Chronic kidney disease   . Headache   . Hypertension     Past Surgical History:  Procedure Laterality Date  . APPENDECTOMY    . FOREIGN BODY REMOVAL  10/22/2011   Procedure: FOREIGN BODY REMOVAL ADULT;  Surgeon: Cammie Sickle., MD;  Location: [facility];  Service: Orthopedics;  Laterality: Right;  right index      reports that he has quit smoking. He has never used smokeless tobacco. He reports current alcohol use. He reports that he does not use drugs.  Allergies  Allergen Reactions  . Milk-Related Compounds Itching and Swelling  . Doxycycline Itching    Stomach pain  . Latex Other (See Comments) and Dermatitis    Blisters on hands and feet  . Prednisone Other (See Comments)    cramping  . Shellfish-Derived Products Itching    Family History  Problem Relation Age of Onset  . Cancer Mother        uterine  . Cancer Sister        breast  . Parkinson's disease Maternal Grandmother      Prior to Admission medications   Medication Sig Start Date End Date Taking? Authorizing Provider  acetaminophen (TYLENOL) 325 MG tablet Take 650 mg by mouth every 6 (six) hours as needed.    [provider]  Cholecalciferol (D3 ADULT PO) Take 1,000 Units by mouth.    [provider]  donepezil (ARICEPT) 10 MG tablet TAKE 1 TABLET BY MOUTH EVERYDAY AT BEDTIME 05/05/18   Jaffe, Adam R, DO  latanoprost (XALATAN) 0.005 % ophthalmic solution INSTILL 1 DROP INTO BOTH EYES EVERY NIGHT AT BEDTIME 04/12/15   [provider]  Multiple Vitamins-Minerals (PRESERVISION AREDS PO) Take by mouth.  [provider]  Probiotic Product (PROBIOTIC + OMEGA-3 PO) Take by mouth.    [provider]    Physical Exam: Vitals:   04/04/19 1745 04/04/19 1800 04/04/19 1815 04/04/19 1845  BP: 130/86 120/78 126/82 128/80  Pulse: 90 84 86 78  Resp: (!) 24  12 16   Temp:      TempSrc:      SpO2: 97% 96% 96% 95%      Constitutional: Confused, agitated, currently sedated Vitals:   04/04/19 1745 04/04/19 1800 04/04/19 1815 04/04/19 1845  BP: 130/86 120/78 126/82 128/80  Pulse: 90 84 86 78  Resp: (!) 24  12 16   Temp:      TempSrc:      SpO2: 97% 96% 96% 95%   Eyes: PERRL, lids and conjunctivae normal ENMT:  Mucous membranes are moist. Posterior pharynx clear of any exudate or lesions.Normal dentition.  Neck: normal, supple, no masses, no thyromegaly Respiratory: clear to auscultation bilaterally, no wheezing, no crackles. Normal respiratory effort. No accessory muscle use.  Cardiovascular: Regular rate and rhythm, no murmurs / rubs / gallops. No extremity edema. 2+ pedal pulses. No carotid bruits.  Abdomen: no tenderness, no masses palpated. No hepatosplenomegaly. Bowel sounds positive.  Musculoskeletal: no clubbing / cyanosis. No joint deformity upper and lower extremities. Good ROM, no contractures. Normal muscle tone.  Skin: no rashes, lesions, ulcers. No induration Neurologic: CN 2-12 grossly intact. Sensation intact, DTR normal. Strength 5/5 in all 4.  Psychiatric: Obtunded and agitated,      Labs on Admission: I have personally reviewed following labs and imaging studies  CBC: Recent Labs  Lab 04/04/19 1702  WBC 14.2*  NEUTROABS 11.6*  HGB 13.4  HCT 40.8  MCV 93.8  PLT 956*   Basic Metabolic Panel: Recent Labs  Lab 04/04/19 1702  NA 135  K 4.1  CL 100  CO2 25  GLUCOSE 124*  BUN 13  CREATININE 1.01  CALCIUM 9.2   GFR: CrCl cannot be calculated (Unknown ideal weight.). Liver Function Tests: Recent Labs  Lab 04/04/19 1702  AST 21  ALT 15  ALKPHOS 64  BILITOT 1.2  PROT 6.8  ALBUMIN 4.1   No results for input(s): LIPASE, AMYLASE in the last 168 hours. No results for input(s): AMMONIA in the last 168 hours. Coagulation Profile: No results for input(s): INR, PROTIME in the last 168 hours. Cardiac Enzymes: No results for input(s): CKTOTAL, CKMB, CKMBINDEX, TROPONINI in the last 168 hours. BNP (last 3 results) No results for input(s): PROBNP in the last 8760 hours. HbA1C: No results for input(s): HGBA1C in the last 72 hours. CBG: Recent Labs  Lab 04/04/19 1717  GLUCAP 106*   Lipid Profile: No results for input(s): CHOL, HDL, LDLCALC, TRIG, CHOLHDL,  LDLDIRECT in the last 72 hours. Thyroid Function Tests: No results for input(s): TSH, T4TOTAL, FREET4, T3FREE, THYROIDAB in the last 72 hours. Anemia Panel: No results for input(s): VITAMINB12, FOLATE, FERRITIN, TIBC, IRON, RETICCTPCT in the last 72 hours. Urine analysis:    Component Value Date/Time   COLORURINE YELLOW 04/04/2019 1702   APPEARANCEUR CLEAR 04/04/2019 1702   LABSPEC 1.023 04/04/2019 1702   PHURINE 7.0 04/04/2019 1702   GLUCOSEU NEGATIVE 04/04/2019 1702   HGBUR NEGATIVE 04/04/2019 1702   BILIRUBINUR NEGATIVE 04/04/2019 [facility] 04/04/2019 1702   PROTEINUR 30 (A) 04/04/2019 1702   NITRITE POSITIVE (A) 04/04/2019 1702   LEUKOCYTESUR NEGATIVE 04/04/2019 1702   Sepsis Labs: @LABRCNTIP (procalcitonin:4,lacticidven:4) )No results found for this or any  previous visit (from the past 240 hour(s)).   Radiological Exams on Admission: Dg Chest Port 1 View  Result Date: 04/04/2019 CLINICAL DATA:  78 year old male with increased weakness.  Fever. EXAM: PORTABLE CHEST 1 VIEW COMPARISON:  Chest radiographs 10/15/2013. FINDINGS: Portable AP semi upright view at 1702 hours. Lower lung volumes. Mediastinal contours remain normal. Visualized tracheal air column is within normal limits. Allowing for portable technique the lungs are clear. Negative visible bowel gas pattern. No acute osseous abnormality identified. IMPRESSION: Lower lung volumes.  No acute cardiopulmonary abnormality. Electronically Signed   By: Genevie Ann M.D.   On: 04/04/2019 17:15    Assessment/Plan Principal Problem:   Acute metabolic encephalopathy Active Problems:   Essential hypertension   Mild cognitive impairment with memory loss   Cerebrovascular disease   UTI (urinary tract infection)   Decubitus ulcer of left foot, unstageable (HCC)     #1 metabolic encephalopathy: Most likely secondary to UTI.  Patient will be admitted and treated accordingly.  Supportive care.  Transition to home care  with antibiotics once he is back to baseline.  #2 urinary tract infection: Possibly even pyelonephritis based on patient's presentation.  Initiate IV Rocephin.  Blood and urine cultures already obtained.  Follow results closely.  #3 hypertension: Resume home medications.  #4 history of CVA: Appears to have recovered mostly.  Continue to monitor.  #5 left foot cellulitis: With some mild skin breakdown.  Wound care.  Continue Rocephin  #6 dementia: Some agitation today probably related to acute delirium.  Continue Aricept.  Monitor closely   DVT prophylaxis: Lovenox Code Status: Full code Family Communication: Wife over the phone Disposition Plan: To be determined Consults called: None Admission status: Inpatient  Severity of Illness: The appropriate patient status for this patient is INPATIENT. Inpatient status is judged to be reasonable and necessary in order to provide the required intensity of service to ensure the patient's safety. The patient's presenting symptoms, physical exam findings, and initial radiographic and laboratory data in the context of their chronic comorbidities is felt to place them at high risk for further clinical deterioration. Furthermore, it is not anticipated that the patient will be medically stable for discharge from the hospital within 2 midnights of admission. The following factors support the patient status of inpatient.   " The patient's presenting symptoms include altered mental status. " The worrisome physical exam findings include confusion. " The initial radiographic and laboratory data are worrisome because of evidence of UTI. " The chronic co-morbidities include dementia.   * I certify that at the point of admission it is my clinical judgment that the patient will require inpatient hospital care spanning beyond 2 midnights from the point of admission due to high intensity of service, high risk for further deterioration and high frequency of  surveillance required.Barbette Merino MD [facility] Pager [phone]  If 7PM-7AM, please contact night-coverage [email or web address] Password [facility]  04/04/2019, 6:53 PM

## 2019-04-04 NOTE — ED Notes (Addendum)
ED TO INPATIENT HANDOFF REPORT  ED Nurse Name and Phone #:  Lonn Georgia [phone]  S Name/Age/Gender Steven Orozco 78 y.o. male Room/Bed: 045C/045C  Code Status   Code Status: Not on file  Home/SNF/Other Home Patient oriented to: self Is this baseline? Yes   Triage Complete: Triage complete  Chief Complaint Lethargic, Dementia  Triage Note Pt from home via [facility], family called out for slidoing out of chair but denied transport, pt had increased weakness throughout the day so they called EMS again, pt has alzheimer's but is more confused/increased generalized weakness than normal. Wound on foot that he has been taking abx for. Pt placed on 4L for 93% on RA.    Allergies Allergies  Allergen Reactions  . Milk-Related Compounds Itching and Swelling  . Doxycycline Itching    Stomach pain  . Latex Other (See Comments) and Dermatitis    Blisters on hands and feet  . Prednisone Other (See Comments)    cramping  . Shellfish-Derived Products Itching    Level of Care/Admitting Diagnosis ED Disposition    ED Disposition Condition [facility]: [place] [[id number]]  Level of Care: Med-Surg [16]  Covid Evaluation: N/A  Diagnosis: Acute metabolic encephalopathy [[id number]]  Admitting Physician: Elwyn Reach [[id number]]  Attending Physician: Elwyn Reach [[id number]]  Estimated length of stay: past midnight tomorrow  Certification:: I certify this patient will need inpatient services for at least 2 midnights  PT Class (Do Not Modify): Inpatient [101]  PT Acc Code (Do Not Modify): Private [1]       B Medical/Surgery History Past Medical History:  Diagnosis Date  . Alzheimer's dementia ([place])   . Cancer ([place])   . Chronic kidney disease   . Headache   . Hypertension    Past Surgical History:  Procedure Laterality Date  . APPENDECTOMY    . FOREIGN BODY REMOVAL  10/22/2011   Procedure: FOREIGN BODY REMOVAL ADULT;  Surgeon: Cammie Sickle., MD;  Location: [facility];  Service: Orthopedics;  Laterality: Right;  right index      A IV Location/Drains/Wounds Patient Lines/Drains/Airways Status   Active Line/Drains/Airways    Name:   Placement date:   Placement time:   Site:   Days:   Peripheral IV 04/04/19 Left Antecubital   04/04/19    1701    Antecubital   less than 1   Peripheral IV 04/04/19 Right Antecubital   04/04/19    1735    Antecubital   less than 1          Intake/Output Last 24 hours No intake or output data in the 24 hours ending 04/04/19 1855  Labs/Imaging Results for orders placed or performed during the hospital encounter of 04/04/19 (from the past 48 hour(s))  Comprehensive metabolic panel     Status: Abnormal   Collection Time: 04/04/19  5:02 PM  Result Value Ref Range   Sodium 135 135 - 145 mmol/L   Potassium 4.1 3.5 - 5.1 mmol/L   Chloride 100 98 - 111 mmol/L   CO2 25 22 - 32 mmol/L   Glucose, Bld 124 (H) 70 - 99 mg/dL   BUN 13 8 - 23 mg/dL   Creatinine, Ser 1.01 0.61 - 1.24 mg/dL   Calcium 9.2 8.9 - 10.3 mg/dL   Total Protein 6.8 6.5 - 8.1 g/dL   Albumin 4.1 3.5 - 5.0 g/dL   AST 21 15 - 41 U/L  ALT 15 0 - 44 U/L   Alkaline Phosphatase 64 38 - 126 U/L   Total Bilirubin 1.2 0.3 - 1.2 mg/dL   GFR calc non Af Amer >60 >60 mL/min   GFR calc Af Amer >60 >60 mL/min   Anion gap 10 5 - 15    Comment: Performed at [facility] [address] [place], [place] [address]  CBC WITH DIFFERENTIAL     Status: Abnormal   Collection Time: 04/04/19  5:02 PM  Result Value Ref Range   WBC 14.2 (H) 4.0 - 10.5 K/uL   RBC 4.35 4.22 - 5.81 MIL/uL   Hemoglobin 13.4 13.0 - 17.0 g/dL   HCT 40.8 39.0 - 52.0 %   MCV 93.8 80.0 - 100.0 fL   MCH 30.8 26.0 - 34.0 pg   MCHC 32.8 30.0 - 36.0 g/dL   RDW 12.0 11.5 - 15.5 %   Platelets 143 (L) 150 - 400 K/uL   nRBC 0.0 0.0 - 0.2 %   Neutrophils Relative % 82 %   Neutro Abs 11.6 (H) 1.7 - 7.7 K/uL   Lymphocytes Relative 8 %   Lymphs Abs 1.1  0.7 - 4.0 K/uL   Monocytes Relative 8 %   Monocytes Absolute 1.1 (H) 0.1 - 1.0 K/uL   Eosinophils Relative 2 %   Eosinophils Absolute 0.3 0.0 - 0.5 K/uL   Basophils Relative 0 %   Basophils Absolute 0.0 0.0 - 0.1 K/uL   Immature Granulocytes 0 %   Abs Immature Granulocytes 0.03 0.00 - 0.07 K/uL    Comment: Performed at [facility], [place] [address], [place], [place] [address]  Urinalysis, Complete w Microscopic     Status: Abnormal   Collection Time: 04/04/19  5:02 PM  Result Value Ref Range   Color, Urine YELLOW YELLOW   APPearance CLEAR CLEAR   Specific Gravity, Urine 1.023 1.005 - 1.030   pH 7.0 5.0 - 8.0   Glucose, UA NEGATIVE NEGATIVE mg/dL   Hgb urine dipstick NEGATIVE NEGATIVE   Bilirubin Urine NEGATIVE NEGATIVE   Ketones, ur NEGATIVE NEGATIVE mg/dL   Protein, ur 30 (A) NEGATIVE mg/dL   Nitrite POSITIVE (A) NEGATIVE   Leukocytes,Ua NEGATIVE NEGATIVE   RBC / HPF 21-50 0 - 5 RBC/hpf   WBC, UA 0-5 0 - 5 WBC/hpf   Bacteria, UA MANY (A) NONE SEEN   Mucus PRESENT     Comment: Performed at [facility] [address] [place], [place] [address]  Lactic acid, plasma     Status: Abnormal   Collection Time: 04/04/19  5:02 PM  Result Value Ref Range   Lactic Acid, Venous 3.6 (HH) 0.5 - 1.9 mmol/L    Comment: CRITICAL RESULT CALLED TO, READ BACK BY AND VERIFIED WITH: Harvel Quale RN @ 1810 ON 04/04/2019 BY TEMOCHE, H Performed at [facility], [place] [address] [place],  [address]   CBG monitoring, ED     Status: Abnormal   Collection Time: 04/04/19  5:17 PM  Result Value Ref Range   Glucose-Capillary 106 (H) 70 - 99 mg/dL   Dg Chest Port 1 View  Result Date: 04/04/2019 CLINICAL DATA:  78 year old male with increased weakness.  Fever. EXAM: PORTABLE CHEST 1 VIEW COMPARISON:  Chest radiographs 10/15/2013. FINDINGS: Portable AP semi upright view at 1702 hours. Lower lung volumes. Mediastinal contours remain normal. Visualized tracheal air column is within  normal limits. Allowing for portable technique the lungs are clear. Negative  visible bowel gas pattern. No acute osseous abnormality identified. IMPRESSION: Lower lung volumes.  No acute cardiopulmonary abnormality. Electronically Signed   By: Genevie Ann M.D.   On: 04/04/2019 17:15    Pending Labs Unresulted Labs (From admission, onward)    Start     Ordered   04/04/19 1657  SARS Coronavirus 2 (CEPHEID- Performed in [place] hospital lab), Hosp Order  (Symptomatic Patients Labs with Precautions )  Once,   R     04/04/19 1657   04/04/19 1656  Blood Cultures (routine x 2)  BLOOD CULTURE X 2,   STAT     04/04/19 1657   04/04/19 1656  Urine culture  ONCE - STAT,   STAT     04/04/19 1657   Signed and Held  CBC  (enoxaparin (LOVENOX)    CrCl >/= 30 ml/min)  Once,   R    Comments:  Baseline for enoxaparin therapy IF NOT ALREADY DRAWN.  Notify MD if PLT < 100 K.    Signed and Held   Signed and Held  Creatinine, serum  (enoxaparin (LOVENOX)    CrCl >/= 30 ml/min)  Once,   R    Comments:  Baseline for enoxaparin therapy IF NOT ALREADY DRAWN.    Signed and Held   Signed and Held  Creatinine, serum  (enoxaparin (LOVENOX)    CrCl >/= 30 ml/min)  Weekly,   R    Comments:  while on enoxaparin therapy    Signed and Held   Signed and Held  Comprehensive metabolic panel  Tomorrow morning,   R     Signed and Held   Signed and Held  CBC  Tomorrow morning,   R     Signed and Held          Vitals/Pain Today's Vitals   04/04/19 1745 04/04/19 1800 04/04/19 1815 04/04/19 1845  BP: 130/86 120/78 126/82 128/80  Pulse: 90 84 86 78  Resp: (!) 24  12 16   Temp:      TempSrc:      SpO2: 97% 96% 96% 95%    Isolation Precautions Droplet and Contact precautions  Medications Medications  cefTRIAXone (ROCEPHIN) 1 g in sodium chloride 0.9 % 100 mL IVPB (1 g Intravenous New Bag/Given 04/04/19 1846)  acetaminophen (TYLENOL) suppository 650 mg (650 mg Rectal Given 04/04/19 1723)  LORazepam (ATIVAN) injection  0.5 mg (0.5 mg Intravenous Given 04/04/19 1746)  sodium chloride 0.9 % bolus 1,000 mL (1,000 mLs Intravenous New Bag/Given 04/04/19 1824)    Mobility unsure High fall risk   Focused Assessments    R Recommendations: See Admitting Provider Note  Report given to: Kyung Rudd  Additional Notes:

## 2019-04-04 NOTE — ED Provider Notes (Signed)
[facility] EMERGENCY DEPARTMENT Provider Note   CSN: [id number] Arrival date & time: 04/04/19  1641    History   Chief Complaint Chief Complaint  Patient presents with  . Weakness    HPI Steven Orozco is a 78 y.o. male with a hx of HTN, CKD, & alzheimer's who arrives to the ED via EMS for generalized weakness & AMS that began this AM when he woke up.  Level 5 caveat applies secondary to patient's AMS.  History provided by patient's wife via telephone. She states the patient is forgetful and disoriented w/ his hx of Alzheimer's but is typically conversational @ baseline. This AM he woke up in his recliner and did not want to get up at all & was not very talkative which was atypical for him. He was generally weak, family was unable to assist him to his wheelchair, and he unfortunately slid to the ground. No head injury or LOC, not on anticoagulation. EMS was called to assist him to his wheelchair but family did not want him transported to the ER at that time. He has not been interested in eating much today. He took a nap and when he woke up he was more confused and not talkative therefore EMS was re-called. He has been on abx for a L foot celluitis- Keflex TID a couple of weeks ago seemed to improve it but it came back recently therefore he was re-started on Keflex QID- it seems as though he has had 2 days of this abx and it is doing well per his wife. Patient's wife has not noted any cough, trouble breathing, vomiting, or diarrhea. Patient is to be transitioned to [facility] memory unit this upcoming Wednesday 05/13 due to his wife having trouble caring for him at home.    HPI  Past Medical History:  Diagnosis Date  . Cancer ([place])   . Chronic kidney disease   . Headache   . Hypertension     Patient Active Problem List   Diagnosis Date Noted  . Episodic tension-type headache, not intractable 01/15/2016  . Mild cognitive impairment with memory loss 07/11/2015   . Cerebrovascular disease 07/11/2015  . Memory deficits 05/15/2015  . Hereditary and idiopathic peripheral neuropathy 05/15/2015  . Cervicogenic headache 05/15/2015  . HYPERTENSION 03/08/2010  . CONTACT DERMATITIS&OTHER ECZEMA DUE TO PLANTS 03/08/2010    Past Surgical History:  Procedure Laterality Date  . APPENDECTOMY    . FOREIGN BODY REMOVAL  10/22/2011   Procedure: FOREIGN BODY REMOVAL ADULT;  Surgeon: Cammie Sickle., MD;  Location: [place];  Service: Orthopedics;  Laterality: Right;  right index         Home Medications    Prior to Admission medications   Medication Sig Start Date End Date Taking? Authorizing Provider  acetaminophen (TYLENOL) 325 MG tablet Take 650 mg by mouth every 6 (six) hours as needed.    [provider]  Cholecalciferol (D3 ADULT PO) Take 1,000 Units by mouth.    [provider]  donepezil (ARICEPT) 10 MG tablet TAKE 1 TABLET BY MOUTH EVERYDAY AT BEDTIME 05/05/18   Jaffe, Adam R, DO  latanoprost (XALATAN) 0.005 % ophthalmic solution INSTILL 1 DROP INTO BOTH EYES EVERY NIGHT AT BEDTIME 04/12/15   [provider]  Multiple Vitamins-Minerals (PRESERVISION AREDS PO) Take by mouth.    [provider]  Probiotic Product (PROBIOTIC + OMEGA-3 PO) Take by mouth.    [provider]    [place]  History Family History  Problem Relation Age of Onset  . Cancer Mother        uterine  . Cancer Sister        breast  . Parkinson's disease Maternal Grandmother     Social History Social History   Tobacco Use  . Smoking status: Former Research scientist (life sciences)  . Smokeless tobacco: Never Used  Substance Use Topics  . Alcohol use: Yes    Alcohol/week: 0.0 standard drinks    Comment: rare  . Drug use: No     Allergies   Milk-related compounds; Doxycycline; Latex; Prednisone; and Shellfish-derived products   Review of Systems Review of Systems  Unable to perform ROS: Mental status change (& dementia)      Physical Exam Updated Vital Signs BP 128/80   Pulse 78   Temp (!) 100.9 F (38.3 C) (Rectal)   Resp 16   SpO2 95%   Physical Exam Vitals signs and nursing note reviewed. Exam conducted with a chaperone present.  Constitutional:      General: He is not in acute distress.    Appearance: He is not toxic-appearing.  HENT:     Head: Normocephalic and atraumatic.     Comments: No racoon eyes or battle sign.  Eyes:     Pupils: Pupils are equal, round, and reactive to light.  Neck:     Comments: No midline tenderness.  Cardiovascular:     Rate and Rhythm: Normal rate and regular rhythm.     Pulses:          Dorsalis pedis pulses are 2+ on the right side and 2+ on the left side.  Pulmonary:     Effort: Pulmonary effort is normal.     Breath sounds: Normal breath sounds.  Abdominal:     General: There is no distension.     Palpations: Abdomen is soft.     Tenderness: There is no abdominal tenderness.  Genitourinary:    Comments: Pressure ulcer noted to the buttocks area.  Musculoskeletal:     Comments: No midline spinal tenderness. No focal tenderness to extremities.   Skin:    General: Skin is warm and dry.     Comments: Small area of erythema to the dorsum of the L foot without palpable fluctuance/induration, no purulent drainage.   Neurological:     Mental Status: He is alert.     Comments: Patient oriented to self only. Answers some yes/no questions. Does not follow commands or cooperative w/ exam therefore limited. Moving all extremities without obvious deficits.   Psychiatric:     Comments: Agitated.     ED Treatments / Results  Labs (all labs ordered are listed, but only abnormal results are displayed) Labs Reviewed  COMPREHENSIVE METABOLIC PANEL - Abnormal; Notable for the following components:      Result Value   Glucose, Bld 124 (*)    All other components within normal limits  CBC WITH DIFFERENTIAL/PLATELET - Abnormal; Notable for the following components:    WBC 14.2 (*)    Platelets 143 (*)    Neutro Abs 11.6 (*)    Monocytes Absolute 1.1 (*)    All other components within normal limits  URINALYSIS, COMPLETE (UACMP) WITH MICROSCOPIC - Abnormal; Notable for the following components:   Protein, ur 30 (*)    Nitrite POSITIVE (*)    Bacteria, UA MANY (*)    All other components within normal limits  LACTIC ACID, PLASMA - Abnormal; Notable for the following components:  Lactic Acid, Venous 3.6 (*)    All other components within normal limits  CBG MONITORING, ED - Abnormal; Notable for the following components:   Glucose-Capillary 106 (*)    All other components within normal limits  CULTURE, BLOOD (ROUTINE X 2)  CULTURE, BLOOD (ROUTINE X 2)  URINE CULTURE  SARS CORONAVIRUS 2 (HOSPITAL ORDER, [facility])    EKG EKG Interpretation  Date/Time:  "Sunday Apr 04 2019 16:52:29 EDT Ventricular Rate:  85 PR Interval:    QRS Duration: 102 QT Interval:  374 QTC Calculation: 445 R Axis:   2 Text Interpretation:  Sinus rhythm Short PR interval Abnormal R-wave progression, early transition Nonspecific T abnormalities, lateral leads No prior ECG for comparison.  No STEMI Confirmed by Tegeler, Chris ([id number]) on 04/04/2019 7:24:43 PM   Radiology Dg Chest Port 1 View  Result Date: 04/04/2019 CLINICAL DATA:  77 year old male with increased weakness.  Fever. EXAM: PORTABLE CHEST 1 VIEW COMPARISON:  Chest radiographs 10/15/2013. FINDINGS: Portable AP semi upright view at 1702 hours. Lower lung volumes. Mediastinal contours remain normal. Visualized tracheal air column is within normal limits. Allowing for portable technique the lungs are clear. Negative visible bowel gas pattern. No acute osseous abnormality identified. IMPRESSION: Lower lung volumes.  No acute cardiopulmonary abnormality. Electronically Signed   By: H  Hall M.D.   On: 04/04/2019 17:15    Procedures Procedures (including critical care time)  Medications  Ordered in ED Medications - No data to display   Initial Impression / Assessment and Plan / ED Course  I have reviewed the triage vital signs and the nursing notes.  Pertinent labs & imaging results that were available during my care of the patient were reviewed by me and considered in my medical decision making (see chart for details).   Patient w/ hx of alzheimer's dementia presents to the ED for generalized weakness & AMS. Febrile to 100.9 rectally upon arrival, not hypotensive or tachycardic- does have small area of erythema to the dorsum of the L foot but does not appear significantly infected to be causing AMS w/ fever. Plan for infectious work-up. He did slide down in his recliner today, but no head injury or anticoagulation, do not feel CT head is necessary currently, low suspicion for bleed.   Dr. Ray has ordered ativan as patient became increasingly agitated and uncooperative w/ staff.   UA w/ UTI CBC w/ associated leukocytosis @ 14.2 w/ L shift. Also w/ mild thrombocytopenia, no anemia.  CMP: w/ hyperglycemia otherwise WNL Lactic acid elevated @ 3.6 CXR: no acute abnormality EKG:  COVID: Pending Urine/blood cultures- pending.   Overall suspect UTI w/ associated leukocytosis & elevated lactic acid as etiology to patient's sxs. He does meet SIRS criteria, not hypotensive, lactic < 4, will start w/ 1 L of fluids as opposed to 30 cc/kg bolus. Abx ordered. Consult placed to hospitalist service.     18" :50: CONSULT: Discussed w/ hospitalist Dr. Jonelle Sidle- accepts admission.   Cope Marte was evaluated in Emergency Department on 04/04/2019 for the symptoms described in the history of present illness. He/she was evaluated in the context of the global COVID-19 pandemic, which necessitated consideration that the patient might be at risk for infection with the SARS-CoV-2 virus that causes COVID-19. Institutional protocols and algorithms that pertain to the evaluation of patients at risk for  COVID-19 are in a state of rapid change based on information released by regulatory bodies including the CDC and federal and state organizations.  These policies and algorithms were followed during the patient's care in the ED.   This is a shared visit with supervising physician Dr. Jeanell Sparrow who has independently evaluated patient & is in agreement.   Final Clinical Impressions(s) / ED Diagnoses   Final diagnoses:  Acute cystitis without hematuria    ED Discharge Orders    None       Leafy Kindle 04/04/19 2011    Pattricia Boss, MD 04/05/19 1731

## 2019-04-04 NOTE — ED Notes (Signed)
This Rn called report to [facility], was told the room is waiting to be cleaned due to RN's request to have pt near nursing station, was told they will call this RN when room is clean. Charge nurse Santiago Glad notified of delay.

## 2019-04-04 NOTE — ED Triage Notes (Signed)
Pt from home via [facility], family called out for slidoing out of chair but denied transport, pt had increased weakness throughout the day so they called EMS again, pt has alzheimer's but is more confused/increased generalized weakness than normal. Wound on foot that he has been taking abx for. Pt placed on 4L for 93% on RA.

## 2019-04-05 DIAGNOSIS — N3000 Acute cystitis without hematuria: Principal | ICD-10-CM

## 2019-04-05 DIAGNOSIS — G3184 Mild cognitive impairment, so stated: Secondary | ICD-10-CM

## 2019-04-05 DIAGNOSIS — I10 Essential (primary) hypertension: Secondary | ICD-10-CM

## 2019-04-05 MED ORDER — ACETAMINOPHEN 325 MG PO TABS
650.0000 mg | ORAL_TABLET | Freq: Four times a day (QID) | ORAL | Status: DC | PRN
  Administered 2019-04-08: 650 mg via ORAL
  Filled 2019-04-05: qty 2

## 2019-04-05 NOTE — Progress Notes (Signed)
Progress Note    [name]  YIF:[id number] DOB: 02/10/1941  DOA: 04/04/2019 PCP: Lavone Orn, MD    Brief Narrative:    Medical records reviewed and are as summarized below:  Steven Orozco is an 78 y.o. male  with medical history significant of Alzheimer's dementia, hypertension, chronic kidney disease stage III, who was brought in to the ER with worsening mental status and agitation. Hx Alzheimer's but is typically conversational @ baseline. This AM he woke up in his recliner and did not want to get up at all & was not very talkative which was atypical for him.  Assessment/Plan:   Principal Problem:   Acute metabolic encephalopathy Active Problems:   Essential hypertension   Mild cognitive impairment with memory loss   Cerebrovascular disease   UTI (urinary tract infection)   Decubitus ulcer of left foot, unstageable (HCC)  metabolic encephalopathy due to UTI.  -Transition to home care with antibiotics once he is back to baseline.  urinary tract infection: - Initiate IV Rocephin.   -Blood and urine cultures pending  hypertension:  -Resume home medications.  left foot cellulitis:  -With some mild skin breakdown - Continue Rocephin  dementia:  -Continue Aricept once verified with pharmacy admission -baseline: short shuffling steps, feeds self   Family Communication/Anticipated D/C date and plan/Code Status   DVT prophylaxis: Lovenox ordered. Code Status: Full Code.  Family Communication: called wife Disposition Plan: pending improvement-- needs continued IV abx and return to baseline-- plan is to d/c to [facility] Consultants:   none  Subjective:   Confused-- has removed clothes and covers and is naked  Objective:    Vitals:   04/04/19 2115 04/04/19 2201 04/05/19 0405 04/05/19 0929  BP: 124/85 133/90 127/81 104/71  Pulse: 74 76 66 72  Resp:  18 16 15   Temp:  99.2 F (37.3 C) 98.7 F (37.1 C) 98.7 F (37.1 C)    TempSrc:  Oral Oral Oral  SpO2: 95% 95% 97% 95%    Intake/Output Summary (Last 24 hours) at 04/05/2019 1013 Last data filed at 04/05/2019 0457 Gross per 24 hour  Intake 450.11 ml  Output 700 ml  Net -249.89 ml   There were no vitals filed for this visit.  Exam: Awake but confused-- has removed gown and pull off covers, laying naked in bed rrr Skin warm and dry +BS, soft, NT No increased work of breathing Appears ill   Data Reviewed:   I have personally reviewed following labs and imaging studies:  Labs: Labs show the following:   Basic Metabolic Panel: Recent Labs  Lab 04/04/19 1702  NA 135  K 4.1  CL 100  CO2 25  GLUCOSE 124*  BUN 13  CREATININE 1.01  CALCIUM 9.2   GFR CrCl cannot be calculated (Unknown ideal weight.). Liver Function Tests: Recent Labs  Lab 04/04/19 1702  AST 21  ALT 15  ALKPHOS 64  BILITOT 1.2  PROT 6.8  ALBUMIN 4.1   No results for input(s): LIPASE, AMYLASE in the last 168 hours. No results for input(s): AMMONIA in the last 168 hours. Coagulation profile No results for input(s): INR, PROTIME in the last 168 hours.  CBC: Recent Labs  Lab 04/04/19 1702  WBC 14.2*  NEUTROABS 11.6*  HGB 13.4  HCT 40.8  MCV 93.8  PLT 143*   Cardiac Enzymes: No results for input(s): CKTOTAL, CKMB, CKMBINDEX, TROPONINI in the last 168 hours. BNP (last 3 results) No results for input(s): PROBNP  in the last 8760 hours. CBG: Recent Labs  Lab 04/04/19 1717  GLUCAP 106*   D-Dimer: No results for input(s): DDIMER in the last 72 hours. Hgb A1c: No results for input(s): HGBA1C in the last 72 hours. Lipid Profile: No results for input(s): CHOL, HDL, LDLCALC, TRIG, CHOLHDL, LDLDIRECT in the last 72 hours. Thyroid function studies: No results for input(s): TSH, T4TOTAL, T3FREE, THYROIDAB in the last 72 hours.  Invalid input(s): FREET3 Anemia work up: No results for input(s): VITAMINB12, FOLATE, FERRITIN, TIBC, IRON, RETICCTPCT in the last  72 hours. Sepsis Labs: Recent Labs  Lab 04/04/19 1702  WBC 14.2*  LATICACIDVEN 3.6*    Microbiology Recent Results (from the past 240 hour(s))  SARS Coronavirus 2 (CEPHEID- Performed in [place] hospital lab), Hosp Order     Status: None   Collection Time: 04/04/19  5:02 PM  Result Value Ref Range Status   SARS Coronavirus 2 NEGATIVE NEGATIVE Final    Comment: (NOTE) If result is NEGATIVE SARS-CoV-2 target nucleic acids are NOT DETECTED. The SARS-CoV-2 RNA is generally detectable in upper and lower  respiratory specimens during the acute phase of infection. The lowest  concentration of SARS-CoV-2 viral copies this assay can detect is 250  copies / mL. A negative result does not preclude SARS-CoV-2 infection  and should not be used as the sole basis for treatment or other  patient management decisions.  A negative result may occur with  improper specimen collection / handling, submission of specimen other  than nasopharyngeal swab, presence of viral mutation(s) within the  areas targeted by this assay, and inadequate number of viral copies  (<250 copies / mL). A negative result must be combined with clinical  observations, patient history, and epidemiological information. If result is POSITIVE SARS-CoV-2 target nucleic acids are DETECTED. The SARS-CoV-2 RNA is generally detectable in upper and lower  respiratory specimens dur ing the acute phase of infection.  Positive  results are indicative of active infection with SARS-CoV-2.  Clinical  correlation with patient history and other diagnostic information is  necessary to determine patient infection status.  Positive results do  not rule out bacterial infection or co-infection with other viruses. If result is PRESUMPTIVE POSTIVE SARS-CoV-2 nucleic acids MAY BE PRESENT.   A presumptive positive result was obtained on the submitted specimen  and confirmed on repeat testing.  While 2019 novel coronavirus  (SARS-CoV-2) nucleic  acids may be present in the submitted sample  additional confirmatory testing may be necessary for epidemiological  and / or clinical management purposes  to differentiate between  SARS-CoV-2 and other Sarbecovirus currently known to infect humans.  If clinically indicated additional testing with an alternate test  methodology [phone]) is advised. The SARS-CoV-2 RNA is generally  detectable in upper and lower respiratory sp ecimens during the acute  phase of infection. The expected result is Negative. Fact Sheet for Patients:  StrictlyIdeas.no Fact Sheet for Healthcare Providers: BankingDealers.co.za This test is not yet approved or cleared by the [place] FDA and has been authorized for detection and/or diagnosis of SARS-CoV-2 by FDA under an Emergency Use Authorization (EUA).  This EUA will remain in effect (meaning this test can be used) for the duration of the COVID-19 declaration under Section 564(b)(1) of the Act, 21 U.S.C. section 360bbb-3(b)(1), unless the authorization is terminated or revoked sooner. Performed at [facility], [place] [address], [place], [place] [address]   Blood Cultures (routine x 2)     Status: None (Preliminary  result)   Collection Time: 04/04/19  5:03 PM  Result Value Ref Range Status   Specimen Description BLOOD LEFT ANTECUBITAL  Final   Special Requests   Final    BOTTLES DRAWN AEROBIC AND ANAEROBIC Blood Culture adequate volume   Culture   Final    NO GROWTH < 24 HOURS Performed at [facility], [address] [place], [place] [address]    Report Status PENDING  Incomplete  Blood Cultures (routine x 2)     Status: None (Preliminary result)   Collection Time: 04/04/19  5:20 PM  Result Value Ref Range Status   Specimen Description BLOOD RIGHT ANTECUBITAL  Final   Special Requests   Final    BOTTLES DRAWN AEROBIC AND ANAEROBIC Blood Culture results may not be optimal due to an  inadequate volume of blood received in culture bottles   Culture   Final    NO GROWTH < 24 HOURS Performed at [facility], [place] [address], [place], [place] [address]    Report Status PENDING  Incomplete    Procedures and diagnostic studies:  Dg Chest Port 1 View  Result Date: 04/04/2019 CLINICAL DATA:  78 year old male with increased weakness.  Fever. EXAM: PORTABLE CHEST 1 VIEW COMPARISON:  Chest radiographs 10/15/2013. FINDINGS: Portable AP semi upright view at 1702 hours. Lower lung volumes. Mediastinal contours remain normal. Visualized tracheal air column is within normal limits. Allowing for portable technique the lungs are clear. Negative visible bowel gas pattern. No acute osseous abnormality identified. IMPRESSION: Lower lung volumes.  No acute cardiopulmonary abnormality. Electronically Signed   By: Genevie Ann M.D.   On: 04/04/2019 17:15    Medications:    enoxaparin (LOVENOX) injection  40 mg Subcutaneous Q24H   Continuous Infusions:  cefTRIAXone (ROCEPHIN)  IV     dextrose 5 % and 0.45% NaCl 75 mL/hr at 04/05/19 0406     LOS: 1 day   Geradine Girt  [facility]   How to contact the [facility] Attending or Consulting provider [place] or covering provider during after hours [place], for this patient?  1. Check the care team in [facility] and look for a) attending/consulting [facility] provider listed and b) the [facility] team listed 2. Log into [email or web address] and use [place] universal password to access. If you do not have the password, please contact the hospital operator. 3. Locate the [facility] provider you are looking for under [facility] and page to a number that you can be directly reached. 4. If you still have difficulty reaching the provider, please page the [facility] (Director on Call) for the Hospitalists listed on amion for assistance.  04/05/2019, 10:13 AM

## 2019-04-05 NOTE — Progress Notes (Signed)
Pt admitted to the unit from ED: pt verbally responsive but irritable and agitated upon arrival to the unit. Pt oriented to the unit and room; VSS: telemetry applied and verified with CCMD; NT called to second verify. Skin assessment completed upon arrival to the unit with second RN per protocol; pt noted to have fine rash to skin on back; stage 2 pressure ulcers to bilateral buttocks and foam dsg applied; bed alarm on; call light within reach.  MD paged and notified with pt's behavior and new order received. Will closely monitor. Delia Heady RN   04/04/19 2201  Vitals  Temp 99.2 F (37.3 C)  Temp Source Oral  BP 133/90  MAP (mmHg) 101  BP Location Right Arm  BP Method Automatic  Patient Position (if appropriate) Lying  Pulse Rate 76  Resp 18  Oxygen Therapy  SpO2 95 %  O2 Device Room Air  Pain Assessment  Pain Scale 0-10  Pain Score 0  Faces Pain Scale 0  MEWS Score  MEWS RR 0  MEWS Pulse 0  MEWS Systolic 0  MEWS LOC 0  MEWS Temp 0  MEWS Score 0  MEWS Score Color Nyoka Cowden

## 2019-04-05 NOTE — TOC Initial Note (Signed)
Transition of Care [facility]) - Initial/Assessment Note    Patient Details  Name: Steven Orozco MRN: [id number] Date of Birth: 1941/09/29  Transition of Care [facility]) CM/SW Contact:    Geralynn Ochs, LCSW Phone Number: 04/05/2019, 5:36 PM  Clinical Narrative:    CSW spoke with patient's wife, Juliann Pulse, over the phone to discuss discharge plan. Patient's wife has been struggling to manage patient at home, had set up patient to admit to [facility] on Wednesday 5/13. Per wife, [facility] can also take patient tomorrow if he is stable. Wife provided permission for CSW to reach out to [facility] on admission procedures from the hospital. Patient's wife would prefer that patient admit directly from the hospital rather than take him home, if possible.               Expected Discharge Plan: Memory Care Barriers to Discharge: Continued Medical Work up   Patient Goals and CMS Choice Patient states their goals for this hospitalization and ongoing recovery are:: patient unable to participate in goal setting      Expected Discharge Plan and Services Expected Discharge Plan: Memory Care     Post Acute Care Choice: Nursing Home Living arrangements for the past 2 months: Single Family Home                                      Prior Living Arrangements/Services Living arrangements for the past 2 months: Single Family Home Lives with:: Self, Spouse          Need for Family Participation in Patient Care: Yes (Comment) Care giver support system in place?: Yes (comment)      Activities of Daily Living      Permission Sought/Granted                  Emotional Assessment   Attitude/Demeanor/Rapport: Unable to Assess Affect (typically observed): Unable to Assess Orientation: : Oriented to Self Alcohol / Substance Use: Not Applicable Psych Involvement: No (comment)  Admission diagnosis:  Acute cystitis without hematuria [N30.00] Patient Active Problem List   Diagnosis Date Noted  . Acute metabolic encephalopathy 52/77/8242  . UTI (urinary tract infection) 04/04/2019  . Decubitus ulcer of left foot, unstageable ([place]) 04/04/2019  . Episodic tension-type headache, not intractable 01/15/2016  . Mild cognitive impairment with memory loss 07/11/2015  . Cerebrovascular disease 07/11/2015  . Memory deficits 05/15/2015  . Hereditary and idiopathic peripheral neuropathy 05/15/2015  . Cervicogenic headache 05/15/2015  . Essential hypertension 03/08/2010  . CONTACT DERMATITIS&OTHER ECZEMA DUE TO PLANTS 03/08/2010   PCP:  Lavone Orn, MD Pharmacy:   [id number] [address], [place] [address] Phone: [phone] Fax: [phone]     Social Determinants of Health (SDOH) Interventions    Readmission Risk Interventions No flowsheet data found.

## 2019-04-05 NOTE — Progress Notes (Signed)
Patient continue to remain confused, able to respond to his name, removing TELE and gowns. TELE put on standby due to continuous removal equipment from pt. MD notified of his current situation.  Ave Filter, RN

## 2019-04-05 NOTE — Progress Notes (Signed)
Patient continue to appears confuse and calm but combative with staff when cares are being provided. He refused lab drawn as well as VSS. Rn and staffs continue with verbal explanation and reorientation without success.  Ave Filter, RN

## 2019-04-06 LAB — URINE CULTURE: Culture: >=100000 — AB

## 2019-04-06 MED ORDER — LATANOPROST 0.005 % OP SOLN
1.0000 [drp] | Freq: Every day | OPHTHALMIC | Status: DC
  Administered 2019-04-06 – 2019-04-11 (×5): 1 [drp] via OPHTHALMIC
  Filled 2019-04-06: qty 2.5

## 2019-04-06 MED ORDER — FUROSEMIDE 40 MG PO TABS
40.0000 mg | ORAL_TABLET | Freq: Every day | ORAL | Status: DC
  Administered 2019-04-06 – 2019-04-12 (×6): 40 mg via ORAL
  Filled 2019-04-06 (×7): qty 1

## 2019-04-06 MED ORDER — TUBERCULIN PPD 5 UNIT/0.1ML ID SOLN
5.0000 [IU] | Freq: Once | INTRADERMAL | Status: AC
  Administered 2019-04-06: 20:00:00 5 [IU] via INTRADERMAL
  Filled 2019-04-06 (×3): qty 0.1

## 2019-04-06 MED ORDER — CIPROFLOXACIN IN D5W 400 MG/200ML IV SOLN
400.0000 mg | Freq: Two times a day (BID) | INTRAVENOUS | Status: DC
  Administered 2019-04-06 – 2019-04-12 (×13): 400 mg via INTRAVENOUS
  Filled 2019-04-06 (×13): qty 200

## 2019-04-06 MED ORDER — POTASSIUM CHLORIDE CRYS ER 10 MEQ PO TBCR
10.0000 meq | EXTENDED_RELEASE_TABLET | Freq: Every day | ORAL | Status: DC
  Administered 2019-04-06: 10 meq via ORAL
  Filled 2019-04-06: qty 1

## 2019-04-06 MED ORDER — MEMANTINE HCL ER 28 MG PO CP24
28.0000 mg | ORAL_CAPSULE | Freq: Every day | ORAL | Status: DC
  Administered 2019-04-06 – 2019-04-12 (×7): 28 mg via ORAL
  Filled 2019-04-06 (×7): qty 1

## 2019-04-06 MED ORDER — DONEPEZIL HCL 10 MG PO TABS
10.0000 mg | ORAL_TABLET | Freq: Every day | ORAL | Status: DC
  Administered 2019-04-06 – 2019-04-11 (×6): 10 mg via ORAL
  Filled 2019-04-06 (×6): qty 1

## 2019-04-06 MED ORDER — LORAZEPAM 2 MG/ML IJ SOLN
1.0000 mg | Freq: Once | INTRAMUSCULAR | Status: AC
  Administered 2019-04-06: 02:00:00 1 mg via INTRAVENOUS
  Filled 2019-04-06: qty 1

## 2019-04-06 NOTE — Progress Notes (Signed)
Progress Note    Steven Orozco  TFT:[id number] DOB: [birth year]  DOA: 04/04/2019 PCP: Lavone Orn, MD    Brief Narrative:    Medical records reviewed and are as summarized below:  Steven Orozco is an 78 y.o. male  with medical history significant of Alzheimer's dementia, hypertension, chronic kidney disease stage III, who was brought in to the ER with worsening mental status and agitation. Hx Alzheimer's but is typically conversational @ baseline. This AM he woke up in his recliner and did not want to get up at all & was not very talkative which was atypical for him.  Found to have UTI.    Assessment/Plan:   Principal Problem:   Acute metabolic encephalopathy Active Problems:   Essential hypertension   Mild cognitive impairment with memory loss   Cerebrovascular disease   UTI (urinary tract infection)   metabolic encephalopathy due to UTI.  -Transition to memory care unit after PO intake improves-- asked nurse to document how much patient is eating  urinary tract infection: -IV Rocephin- change to cipro as culture was Ecoli and susceptibility is intermediate to rocephin  hypertension:  -Resume home medications.  left foot cellulitis:  -With some mild skin breakdown - on abx  dementia:  -Continue home meds -baseline: short shuffling steps, feeds self   Family Communication/Anticipated D/C date and plan/Code Status   DVT prophylaxis: Lovenox ordered. Code Status: Full Code.  Family Communication: called wife 5/11 Disposition Plan: pending improvement in PO intake-- needs continued IV abx and return to baseline-- plan is to d/c to [facility] PT eval ordered  Medical Consultants:   none  Subjective:   Combative overnight  Objective:    Vitals:   04/05/19 2026 04/06/19 0448 04/06/19 0840 04/06/19 1134  BP: 126/72 133/79 (!) 157/77 (!) 157/81  Pulse: 63 63 66 67  Resp: 15 17 15 14   Temp: (!) 97.3 F (36.3 C) 98.1 F (36.7 C) 98.4 F (36.9  C) 97.7 F (36.5 C)  TempSrc: Oral Oral Oral Oral  SpO2: 97% 96% 95% 99%    Intake/Output Summary (Last 24 hours) at 04/06/2019 1207 Last data filed at 04/06/2019 1052 Gross per 24 hour  Intake 1838.14 ml  Output 2025 ml  Net -186.86 ml   There were no vitals filed for this visit.  Exam: In bed, will awaken and mumble a few words rrr No increased work of breathing unCooperative with neuro evaluation with me    Data Reviewed:   I have personally reviewed following labs and imaging studies:  Labs: Labs show the following:   Basic Metabolic Panel: Recent Labs  Lab 04/04/19 1702  NA 135  K 4.1  CL 100  CO2 25  GLUCOSE 124*  BUN 13  CREATININE 1.01  CALCIUM 9.2   GFR CrCl cannot be calculated (Unknown ideal weight.). Liver Function Tests: Recent Labs  Lab 04/04/19 1702  AST 21  ALT 15  ALKPHOS 64  BILITOT 1.2  PROT 6.8  ALBUMIN 4.1   No results for input(s): LIPASE, AMYLASE in the last 168 hours. No results for input(s): AMMONIA in the last 168 hours. Coagulation profile No results for input(s): INR, PROTIME in the last 168 hours.  CBC: Recent Labs  Lab 04/04/19 1702  WBC 14.2*  NEUTROABS 11.6*  HGB 13.4  HCT 40.8  MCV 93.8  PLT 143*   Cardiac Enzymes: No results for input(s): CKTOTAL, CKMB, CKMBINDEX, TROPONINI in the last 168 hours. BNP (last 3 results) No results for  input(s): PROBNP in the last 8760 hours. CBG: Recent Labs  Lab 04/04/19 1717  GLUCAP 106*   D-Dimer: No results for input(s): DDIMER in the last 72 hours. Hgb A1c: No results for input(s): HGBA1C in the last 72 hours. Lipid Profile: No results for input(s): CHOL, HDL, LDLCALC, TRIG, CHOLHDL, LDLDIRECT in the last 72 hours. Thyroid function studies: No results for input(s): TSH, T4TOTAL, T3FREE, THYROIDAB in the last 72 hours.  Invalid input(s): FREET3 Anemia work up: No results for input(s): VITAMINB12, FOLATE, FERRITIN, TIBC, IRON, RETICCTPCT in the last 72 hours.  Sepsis Labs: Recent Labs  Lab 04/04/19 1702  WBC 14.2*  LATICACIDVEN 3.6*    Microbiology Recent Results (from the past 240 hour(s))  Urine culture     Status: Abnormal   Collection Time: 04/04/19  5:02 PM  Result Value Ref Range Status   Specimen Description URINE, RANDOM  Final   Special Requests   Final    NONE Performed at [facility], [place] [address], [place], [place] [address]    Culture >=100,000 COLONIES/mL ESCHERICHIA COLI (A)  Final   Report Status 04/06/2019 FINAL  Final   Organism ID, Bacteria ESCHERICHIA COLI (A)  Final      Susceptibility   Escherichia coli - MIC*    AMPICILLIN >=32 RESISTANT Resistant     CEFAZOLIN >=64 RESISTANT Resistant     CEFTRIAXONE 16 INTERMEDIATE Intermediate     CIPROFLOXACIN <=0.25 SENSITIVE Sensitive     GENTAMICIN >=16 RESISTANT Resistant     IMIPENEM <=0.25 SENSITIVE Sensitive     NITROFURANTOIN <=16 SENSITIVE Sensitive     TRIMETH/SULFA <=20 SENSITIVE Sensitive     AMPICILLIN/SULBACTAM >=32 RESISTANT Resistant     PIP/TAZO 64 INTERMEDIATE Intermediate     Extended ESBL NEGATIVE Sensitive     * >=100,000 COLONIES/mL ESCHERICHIA COLI  SARS Coronavirus 2 (CEPHEID- Performed in [place] hospital lab), Hosp Order     Status: None   Collection Time: 04/04/19  5:02 PM  Result Value Ref Range Status   SARS Coronavirus 2 NEGATIVE NEGATIVE Final    Comment: (NOTE) If result is NEGATIVE SARS-CoV-2 target nucleic acids are NOT DETECTED. The SARS-CoV-2 RNA is generally detectable in upper and lower  respiratory specimens during the acute phase of infection. The lowest  concentration of SARS-CoV-2 viral copies this assay can detect is 250  copies / mL. A negative result does not preclude SARS-CoV-2 infection  and should not be used as the sole basis for treatment or other  patient management decisions.  A negative result may occur with  improper specimen collection / handling, submission of specimen other  than nasopharyngeal  swab, presence of viral mutation(s) within the  areas targeted by this assay, and inadequate number of viral copies  (<250 copies / mL). A negative result must be combined with clinical  observations, patient history, and epidemiological information. If result is POSITIVE SARS-CoV-2 target nucleic acids are DETECTED. The SARS-CoV-2 RNA is generally detectable in upper and lower  respiratory specimens dur ing the acute phase of infection.  Positive  results are indicative of active infection with SARS-CoV-2.  Clinical  correlation with patient history and other diagnostic information is  necessary to determine patient infection status.  Positive results do  not rule out bacterial infection or co-infection with other viruses. If result is PRESUMPTIVE POSTIVE SARS-CoV-2 nucleic acids MAY BE PRESENT.   A presumptive positive result was obtained on the submitted specimen  and confirmed on repeat testing.  While 2019 novel coronavirus  (SARS-CoV-2) nucleic acids may be present in the submitted sample  additional confirmatory testing may be necessary for epidemiological  and / or clinical management purposes  to differentiate between  SARS-CoV-2 and other Sarbecovirus currently known to infect humans.  If clinically indicated additional testing with an alternate test  methodology [phone]) is advised. The SARS-CoV-2 RNA is generally  detectable in upper and lower respiratory sp ecimens during the acute  phase of infection. The expected result is Negative. Fact Sheet for Patients:  StrictlyIdeas.no Fact Sheet for Healthcare Providers: BankingDealers.co.za This test is not yet approved or cleared by the [place] FDA and has been authorized for detection and/or diagnosis of SARS-CoV-2 by FDA under an Emergency Use Authorization (EUA).  This EUA will remain in effect (meaning this test can be used) for the duration of the COVID-19 declaration  under Section 564(b)(1) of the Act, 21 U.S.C. section 360bbb-3(b)(1), unless the authorization is terminated or revoked sooner. Performed at [facility], [place] [address], [place], [place] [address]   Blood Cultures (routine x 2)     Status: None (Preliminary result)   Collection Time: 04/04/19  5:03 PM  Result Value Ref Range Status   Specimen Description BLOOD LEFT ANTECUBITAL  Final   Special Requests   Final    BOTTLES DRAWN AEROBIC AND ANAEROBIC Blood Culture adequate volume   Culture   Final    NO GROWTH 2 DAYS Performed at [facility], [place] [address] [place], [place] [address]    Report Status PENDING  Incomplete  Blood Cultures (routine x 2)     Status: None (Preliminary result)   Collection Time: 04/04/19  5:20 PM  Result Value Ref Range Status   Specimen Description BLOOD RIGHT ANTECUBITAL  Final   Special Requests   Final    BOTTLES DRAWN AEROBIC AND ANAEROBIC Blood Culture results may not be optimal due to an inadequate volume of blood received in culture bottles   Culture   Final    NO GROWTH 2 DAYS Performed at [facility], [place] [address], [place], [place] [address]    Report Status PENDING  Incomplete    Procedures and diagnostic studies:  Dg Chest Port 1 View  Result Date: 04/04/2019 CLINICAL DATA:  78 year old male with increased weakness.  Fever. EXAM: PORTABLE CHEST 1 VIEW COMPARISON:  Chest radiographs 10/15/2013. FINDINGS: Portable AP semi upright view at 1702 hours. Lower lung volumes. Mediastinal contours remain normal. Visualized tracheal air column is within normal limits. Allowing for portable technique the lungs are clear. Negative visible bowel gas pattern. No acute osseous abnormality identified. IMPRESSION: Lower lung volumes.  No acute cardiopulmonary abnormality. Electronically Signed   By: Genevie Ann M.D.   On: 04/04/2019 17:15    Medications:   . donepezil  10 mg Oral QHS  . enoxaparin (LOVENOX) injection  40 mg Subcutaneous  Q24H  . latanoprost  1 drop Both Eyes QHS  . memantine  28 mg Oral Daily   Continuous Infusions: . ciprofloxacin Stopped (04/06/19 1047)     LOS: 2 days   Geradine Girt  [facility]   How to contact the [facility] Attending or Consulting provider [place] or covering provider during after hours [place], for this patient?  1. Check the care team in [facility] and look for a) attending/consulting TRH provider listed and b) the [facility] team listed 2. Log into [email or web address] and use [place]'s universal password  to access. If you do not have the password, please contact the hospital operator. 3. Locate the [facility] provider you are looking for under [facility] and page to a number that you can be directly reached. 4. If you still have difficulty reaching the provider, please page the [facility] (Director on Call) for the Hospitalists listed on amion for assistance.  04/06/2019, 12:07 PM

## 2019-04-06 NOTE — Progress Notes (Signed)
Pharmacy Antibiotic Note  Steven Orozco is a 78 y.o. male admitted on 04/04/2019 with UTI.  Pharmacy has been consulted for Cipro dosing. Previously on ceftriaxone (Ecoli in UCx susceptibility is intermediate). Last SCr 1.01 on 5/10 (patient refusing lab draws per RN notes).  Plan: Cipro 400mg  IV q12h Monitor clinical progress, c/s, renal function F/u LOT      Temp (24hrs), Avg:98 F (36.7 C), Min:97.3 F (36.3 C), Max:98.7 F (37.1 C)  Recent Labs  Lab 04/04/19 1702  WBC 14.2*  CREATININE 1.01  LATICACIDVEN 3.6*    CrCl cannot be calculated (Unknown ideal weight.).    Allergies  Allergen Reactions  . Milk-Related Compounds Itching and Swelling  . Doxycycline Itching    Stomach pain  . Latex Other (See Comments) and Dermatitis    Blisters on hands and feet  . Prednisone Other (See Comments)    cramping  . Shellfish-Derived Products Itching   Elicia Lamp, PharmD, BCPS Please check AMION for all [place] contact numbers Clinical Pharmacist 04/06/2019 8:44 AM

## 2019-04-06 NOTE — TOC Progression Note (Signed)
Transition of Care [facility]) - Progression Note    Patient Details  Name: Steven Orozco MRN: [id number] Date of Birth: 1941-07-26  Transition of Care [facility]) CM/SW [place], [place] Phone Number: 04/06/2019, 5:25 PM  Clinical Narrative:   CSW spoke with Marylin Crosby at [facility] to discuss patient's admission. Facility is ready for patient's admission. Facility will need a new FL2 since patient is admitting from the hospital, and would prefer if nurse can provide a detailed report when he is discharged as they have not been able to assess him so they do not have much information. CSW provided contact information if the facility needs anything else. Facility also indicated that he has not had a PPD completed for admission, he will need to have one completed before they can take him. CSW asked MD to order. CSW to follow.    Expected Discharge Plan: Memory Care Barriers to Discharge: Continued Medical Work up  Expected Discharge Plan and Services Expected Discharge Plan: Memory Care     Post Acute Care Choice: Nursing Home Living arrangements for the past 2 months: Single Family Home                                       Social Determinants of Health (SDOH) Interventions    Readmission Risk Interventions No flowsheet data found.

## 2019-04-07 LAB — CBC
HCT: 42.9 % (ref 39.0–52.0)
Hemoglobin: 14.6 g/dL (ref 13.0–17.0)
MCH: 31.3 pg (ref 26.0–34.0)
MCHC: 34 g/dL (ref 30.0–36.0)
MCV: 91.9 fL (ref 80.0–100.0)
Platelets: 176 10*3/uL (ref 150–400)
RBC: 4.67 MIL/uL (ref 4.22–5.81)
RDW: 11.9 % (ref 11.5–15.5)
WBC: 8.1 10*3/uL (ref 4.0–10.5)
nRBC: 0 % (ref 0.0–0.2)

## 2019-04-07 LAB — BASIC METABOLIC PANEL
Anion gap: 10 (ref 5–15)
BUN: 8 mg/dL (ref 8–23)
CO2: 27 mmol/L (ref 22–32)
Calcium: 9.1 mg/dL (ref 8.9–10.3)
Chloride: 102 mmol/L (ref 98–111)
Creatinine, Ser: 1.03 mg/dL (ref 0.61–1.24)
GFR calc Af Amer: >60 mL/min (ref >60)
GFR calc non Af Amer: >60 mL/min (ref >60)
Glucose, Bld: 100 mg/dL — ABNORMAL HIGH (ref 70–99)
Potassium: 3 mmol/L — ABNORMAL LOW (ref 3.5–5.1)
Sodium: 139 mmol/L (ref 135–145)

## 2019-04-07 MED ORDER — POTASSIUM CHLORIDE 10 MEQ/100ML IV SOLN
10.0000 meq | INTRAVENOUS | Status: AC
  Administered 2019-04-07 (×2): 10 meq via INTRAVENOUS
  Filled 2019-04-07: qty 100

## 2019-04-07 MED ORDER — POTASSIUM CHLORIDE CRYS ER 20 MEQ PO TBCR
40.0000 meq | EXTENDED_RELEASE_TABLET | Freq: Once | ORAL | Status: DC
  Filled 2019-04-07: qty 2

## 2019-04-07 MED ORDER — SODIUM CHLORIDE 0.9 % IV SOLN
INTRAVENOUS | Status: AC
  Administered 2019-04-07 (×2): via INTRAVENOUS

## 2019-04-07 NOTE — Evaluation (Signed)
Physical Therapy Evaluation Patient Details Name: Steven Orozco MRN: [id number] DOB: 1941/02/01 Today's Date: 04/07/2019   History of Present Illness  Alzheimer's dementia, hypertension, chronic kidney disease stage III, who was brought in to the ER with worsening mental status and agitation.  Patient lives with his wife via the phone.  He was usually conversational and not agitated but today appears to have changed.  He is noted gradual decline in the last 2 days.  He was not talking and feels and appears very weak.  No falls no injury.    Clinical Impression  Pt admitted with above diagnosis. Pt currently with functional limitations due to the deficits listed below (see PT Problem List). He has poor cooperation and it is difficult to get full mobility assessment due to confusion and agitation. Pt will benefit from skilled PT to increase their independence and safety with mobility to allow discharge to the venue listed below.       Follow Up Recommendations SNF([facility] per chart)          Precautions / Restrictions Precautions Precautions: Fall Restrictions Weight Bearing Restrictions: No      Mobility  Bed Mobility Overal bed mobility: Needs Assistance Bed Mobility: Rolling;Supine to Sit;Sit to Supine Rolling: Mod assist   Supine to sit: Max assist Sit to supine: Max assist   General bed mobility comments: unsure what his is and shows poor cooperation  Transfers Overall transfer level: Needs assistance   Transfers: Sit to/from Stand Sit to Stand: Max assist         General transfer comment: attempted with RW and he was unable to peform due to agitation and poor effort, then was able to perform with PT holding onto to patient with max A and max encouragement needed, he then refused to take any steps or move to a chair and wanted to be returned to bed  Ambulation/Gait             General Gait Details: refused to participate  Stairs            Wheelchair Mobility    Modified Rankin (Stroke Patients Only)       Balance Overall balance assessment: Needs assistance   Sitting balance-Leahy Scale: Poor       Standing balance-Leahy Scale: Poor                               Pertinent Vitals/Pain Pain Assessment: Faces Faces Pain Scale: Hurts little more Pain Location: Back Pain Intervention(s): Limited activity within patient's tolerance;Monitored during session;Repositioned    Home Living Family/patient expects to be discharged to:: Skilled nursing facility                 Additional Comments: Per chart [facility]    Prior Function           Comments: unsure as he is poor historian     Hand Dominance        Extremity/Trunk Assessment   Upper Extremity Assessment Upper Extremity Assessment: Generalized weakness(ROM WFL)    Lower Extremity Assessment Lower Extremity Assessment: Generalized weakness(ROM WFL)       Communication   Communication: (difficult due to dementai)  Cognition Arousal/Alertness: Suspect due to medications Behavior During Therapy: Agitated Overall Cognitive Status: History of cognitive impairments - at baseline  General Comments General comments (skin integrity, edema, etc.): has dressing over sacrum that appears clean and intact        Assessment/Plan    PT Assessment Patient needs continued PT services  PT Problem List Decreased strength;Decreased range of motion;Decreased activity tolerance;Decreased balance;Decreased mobility;Decreased coordination;Decreased cognition;Decreased knowledge of use of DME;Decreased safety awareness;Pain       PT Treatment Interventions DME instruction;Gait training;Functional mobility training;Therapeutic activities;Therapeutic exercise;Balance training;Neuromuscular re-education;Modalities    PT Goals (Current goals can be found in  the Care Plan section)  Acute Rehab PT Goals Patient Stated Goal: did not state PT Goal Formulation: Patient unable to participate in goal setting Time For Goal Achievement: 04/21/19 Potential to Achieve Goals: Fair    Frequency Min 3X/week   Barriers to discharge (unsure about caregiver support)         AM-PAC PT "6 Clicks" Mobility  Outcome Measure Help needed turning from your back to your side while in a flat bed without using bedrails?: A Lot Help needed moving from lying on your back to sitting on the side of a flat bed without using bedrails?: A Lot Help needed moving to and from a bed to a chair (including a wheelchair)?: Total Help needed standing up from a chair using your arms (e.g., wheelchair or bedside chair)?: Total Help needed to walk in hospital room?: Total Help needed climbing 3-5 steps with a railing? : Total 6 Click Score: 8    End of Session Equipment Utilized During Treatment: Gait belt Activity Tolerance: Patient limited by lethargy;Patient limited by pain;Treatment limited secondary to agitation Patient left: in bed;with call bell/phone within reach;with bed alarm set Nurse Communication: Mobility status PT Visit Diagnosis: Unsteadiness on feet (R26.81);Other abnormalities of gait and mobility (R26.89);Muscle weakness (generalized) (M62.81);Difficulty in walking, not elsewhere classified (R26.2);Pain Pain - part of body: (back)    Time: 1130-1155 PT Time Calculation (min) (ACUTE ONLY): 25 min   Charges:   PT Evaluation $PT Eval Moderate Complexity: 1 Mod PT Treatments $Therapeutic Activity: 8-22 mins        Elsie Ra, PT,DPT Acute Rehabilitation Services Office [phone]   Debbe Odea 04/07/2019, 1:18 PM

## 2019-04-07 NOTE — Progress Notes (Signed)
PROGRESS NOTE    Steven Orozco  ZOX:[id number] DOB: 1941-05-19 DOA: 04/04/2019 PCP: Lavone Orn, MD   Brief Narrative:  78 yo with pmhx of Alzheimers dementia, HTN, CKD Stage III for AMS. He was found to have  UTI and LLE cellulitis. Was on Rocephin but due to sensitives it was changed to IV Cipro. PO intake is still inconsistent.    Assessment & Plan:   Principal Problem:   Acute metabolic encephalopathy Active Problems:   Essential hypertension   Mild cognitive impairment with memory loss   Cerebrovascular disease   UTI (urinary tract infection)   Decubitus ulcer of left foot, unstageable (HCC)  Acute metabolic encephalopathy secondary to urinary tract infection from E. coli - According to sensitivities IV Rocephin has been changed to IV Cipro.  We will transition to oral Cipro once his oral intake is adequate.  He remains afebrile and white count is trending down.  Poor oral intake, inconsistent Advanced Alzheimer's dementia -Poor desire due to his advanced dementia.  Continue to provide supportive care  Left lower extremity cellulitis without purulent abscess - Currently on IV Cipro.  Closely monitor this.  Routine skin care  Essential hypertension -On home meds  Physical therapy recommending skilled nursing facility-patient will be going to go for hours memory care unit  DVT prophylaxis: Lovenox  Code Status: Full Code Family Communication: Spoke with the patient's wife over the phone today Disposition Plan: To be determined, his p.o. intake needs to improve so that his antibiotics can be switched to oral otherwise will need to closely monitor his sugars, administer IV fluids and antibiotics.  Consultants:   None  Procedures:   None  Antimicrobials:   Rocephin for 2 days  Cipro day 3   Subjective: Patient is awake and only alert to his name.  He barely ate his breakfast.  His oral intake is extremely poor.  Did not take his medications this morning.   But denies any complaints  Review of Systems Otherwise negative except as per HPI, including: General: Denies fever, chills, night sweats or unintended weight loss. Resp: Denies cough, wheezing, shortness of breath. Cardiac: Denies chest pain, palpitations, orthopnea, paroxysmal nocturnal dyspnea. GI: Denies abdominal pain, nausea, vomiting, diarrhea or constipation GU: Denies dysuria, frequency, hesitancy or incontinence MS: Denies muscle aches, joint pain or swelling Neuro: Denies headache, neurologic deficits (focal weakness, numbness, tingling), abnormal gait Psych: Denies anxiety, depression, SI/HI/AVH Skin: Denies new rashes or lesions ID: Denies sick contacts, exotic exposures, travel  Objective: Vitals:   04/06/19 2012 04/07/19 0322 04/07/19 0850 04/07/19 1132  BP: 117/70 134/72 105/78 116/83  Pulse: 77 77 73 82  Resp: 17 16 18 18   Temp: 98.5 F (36.9 C) 98.2 F (36.8 C) 98.9 F (37.2 C) 98.2 F (36.8 C)  TempSrc: Oral Oral Axillary Axillary  SpO2: 96% 98% 96% 97%    Intake/Output Summary (Last 24 hours) at 04/07/2019 1348 Last data filed at 04/07/2019 0800 Gross per 24 hour  Intake 420 ml  Output 3075 ml  Net -2655 ml   There were no vitals filed for this visit.  Examination:  General exam: Appears calm and comfortable  Respiratory system: Clear to auscultation. Respiratory effort normal. Cardiovascular system: S1 & S2 heard, RRR. No JVD, murmurs, rubs, gallops or clicks. No pedal edema. Gastrointestinal system: Abdomen is nondistended, soft and nontender. No organomegaly or masses felt. Normal bowel sounds heard. Central nervous system: Alert and oriented only to his name. No focal neurological deficits. Extremities: Strength is  4/5 in all extremities Skin: Left lower extremity erythema noted with some skin breakdown Psychiatry: Poor judgment and insight    Data Reviewed:   CBC: Recent Labs  Lab 04/04/19 1702 04/07/19 0344  WBC 14.2* 8.1  NEUTROABS  11.6*  --   HGB 13.4 14.6  HCT 40.8 42.9  MCV 93.8 91.9  PLT 143* 093   Basic Metabolic Panel: Recent Labs  Lab 04/04/19 1702 04/07/19 0344  NA 135 139  K 4.1 3.0*  CL 100 102  CO2 25 27  GLUCOSE 124* 100*  BUN 13 8  CREATININE 1.01 1.03  CALCIUM 9.2 9.1   GFR: CrCl cannot be calculated (Unknown ideal weight.). Liver Function Tests: Recent Labs  Lab 04/04/19 1702  AST 21  ALT 15  ALKPHOS 64  BILITOT 1.2  PROT 6.8  ALBUMIN 4.1   No results for input(s): LIPASE, AMYLASE in the last 168 hours. No results for input(s): AMMONIA in the last 168 hours. Coagulation Profile: No results for input(s): INR, PROTIME in the last 168 hours. Cardiac Enzymes: No results for input(s): CKTOTAL, CKMB, CKMBINDEX, TROPONINI in the last 168 hours. BNP (last 3 results) No results for input(s): PROBNP in the last 8760 hours. HbA1C: No results for input(s): HGBA1C in the last 72 hours. CBG: Recent Labs  Lab 04/04/19 1717  GLUCAP 106*   Lipid Profile: No results for input(s): CHOL, HDL, LDLCALC, TRIG, CHOLHDL, LDLDIRECT in the last 72 hours. Thyroid Function Tests: No results for input(s): TSH, T4TOTAL, FREET4, T3FREE, THYROIDAB in the last 72 hours. Anemia Panel: No results for input(s): VITAMINB12, FOLATE, FERRITIN, TIBC, IRON, RETICCTPCT in the last 72 hours. Sepsis Labs: Recent Labs  Lab 04/04/19 1702  LATICACIDVEN 3.6*    Recent Results (from the past 240 hour(s))  Urine culture     Status: Abnormal   Collection Time: 04/04/19  5:02 PM  Result Value Ref Range Status   Specimen Description URINE, RANDOM  Final   Special Requests   Final    NONE Performed at [facility], [place] [address] [place], [place] [address]    Culture >=100,000 COLONIES/mL ESCHERICHIA COLI (A)  Final   Report Status 04/06/2019 FINAL  Final   Organism ID, Bacteria ESCHERICHIA COLI (A)  Final      Susceptibility   Escherichia coli - MIC*    AMPICILLIN >=32 RESISTANT Resistant      CEFAZOLIN >=64 RESISTANT Resistant     CEFTRIAXONE 16 INTERMEDIATE Intermediate     CIPROFLOXACIN <=0.25 SENSITIVE Sensitive     GENTAMICIN >=16 RESISTANT Resistant     IMIPENEM <=0.25 SENSITIVE Sensitive     NITROFURANTOIN <=16 SENSITIVE Sensitive     TRIMETH/SULFA <=20 SENSITIVE Sensitive     AMPICILLIN/SULBACTAM >=32 RESISTANT Resistant     PIP/TAZO 64 INTERMEDIATE Intermediate     Extended ESBL NEGATIVE Sensitive     * >=100,000 COLONIES/mL ESCHERICHIA COLI  SARS Coronavirus 2 (CEPHEID- Performed in [place] hospital lab), Hosp Order     Status: None   Collection Time: 04/04/19  5:02 PM  Result Value Ref Range Status   SARS Coronavirus 2 NEGATIVE NEGATIVE Final    Comment: (NOTE) If result is NEGATIVE SARS-CoV-2 target nucleic acids are NOT DETECTED. The SARS-CoV-2 RNA is generally detectable in upper and lower  respiratory specimens during the acute phase of infection. The lowest  concentration of SARS-CoV-2 viral copies this assay can detect is 250  copies / mL. A negative result does not preclude SARS-CoV-2 infection  and should not be used as the sole basis for treatment or other  patient management decisions.  A negative result may occur with  improper specimen collection / handling, submission of specimen other  than nasopharyngeal swab, presence of viral mutation(s) within the  areas targeted by this assay, and inadequate number of viral copies  (<250 copies / mL). A negative result must be combined with clinical  observations, patient history, and epidemiological information. If result is POSITIVE SARS-CoV-2 target nucleic acids are DETECTED. The SARS-CoV-2 RNA is generally detectable in upper and lower  respiratory specimens dur ing the acute phase of infection.  Positive  results are indicative of active infection with SARS-CoV-2.  Clinical  correlation with patient history and other diagnostic information is  necessary to determine patient infection status.   Positive results do  not rule out bacterial infection or co-infection with other viruses. If result is PRESUMPTIVE POSTIVE SARS-CoV-2 nucleic acids MAY BE PRESENT.   A presumptive positive result was obtained on the submitted specimen  and confirmed on repeat testing.  While 2019 novel coronavirus  (SARS-CoV-2) nucleic acids may be present in the submitted sample  additional confirmatory testing may be necessary for epidemiological  and / or clinical management purposes  to differentiate between  SARS-CoV-2 and other Sarbecovirus currently known to infect humans.  If clinically indicated additional testing with an alternate test  methodology [id number]) is advised. The SARS-CoV-2 RNA is generally  detectable in upper and lower respiratory sp ecimens during the acute  phase of infection. The expected result is Negative. Fact Sheet for Patients:  StrictlyIdeas.no Fact Sheet for Healthcare Providers: BankingDealers.co.za This test is not yet approved or cleared by the [place] FDA and has been authorized for detection and/or diagnosis of SARS-CoV-2 by FDA under an Emergency Use Authorization (EUA).  This EUA will remain in effect (meaning this test can be used) for the duration of the COVID-19 declaration under Section 564(b)(1) of the Act, 21 U.S.C. section 360bbb-3(b)(1), unless the authorization is terminated or revoked sooner. Performed at [facility], [place] [address], [place], [place] [address]   Blood Cultures (routine x 2)     Status: None (Preliminary result)   Collection Time: 04/04/19  5:03 PM  Result Value Ref Range Status   Specimen Description BLOOD LEFT ANTECUBITAL  Final   Special Requests   Final    BOTTLES DRAWN AEROBIC AND ANAEROBIC Blood Culture adequate volume   Culture   Final    NO GROWTH 2 DAYS Performed at [facility], [place] [address], [place], [place] [address]    Report Status PENDING   Incomplete  Blood Cultures (routine x 2)     Status: None (Preliminary result)   Collection Time: 04/04/19  5:20 PM  Result Value Ref Range Status   Specimen Description BLOOD RIGHT ANTECUBITAL  Final   Special Requests   Final    BOTTLES DRAWN AEROBIC AND ANAEROBIC Blood Culture results may not be optimal due to an inadequate volume of blood received in culture bottles   Culture   Final    NO GROWTH 2 DAYS Performed at [facility], [place] [address], [place],  [address]    Report Status PENDING  Incomplete         Radiology Studies: No results found.      Scheduled Meds: . donepezil  10 mg Oral QHS  . enoxaparin (LOVENOX) injection  40 mg Subcutaneous Q24H  . furosemide  40  mg Oral Daily  . latanoprost  1 drop Both Eyes QHS  . memantine  28 mg Oral Daily  . potassium chloride  40 mEq Oral Once  . tuberculin  5 Units Intradermal Once   Continuous Infusions: . ciprofloxacin 400 mg (04/07/19 0830)     LOS: 3 days   Time spent= 25 mins    Addalyn Speedy Arsenio Loader, MD [facility]  If 7PM-7AM, please contact night-coverage [email or web address] 04/07/2019, 1:48 PM

## 2019-04-08 LAB — GLUCOSE, CAPILLARY: Glucose-Capillary: 99 mg/dL (ref 70–99)

## 2019-04-08 LAB — TSH: TSH: 2.252 u[IU]/mL (ref 0.350–4.500)

## 2019-04-08 LAB — AMMONIA: Ammonia: 28 umol/L (ref 9–35)

## 2019-04-08 LAB — VITAMIN B12: Vitamin B-12: 220 pg/mL (ref 180–914)

## 2019-04-08 NOTE — NC FL2 (Signed)
[place] LEVEL OF CARE SCREENING TOOL     IDENTIFICATION  Patient Name: Steven Orozco Birthdate: January 08, 1941 Sex: male Admission Date (Current Location): 04/04/2019  [facility] and [place] Number:  Herbalist and Address:  [facility] [place], [place] [address] [place], [place] [address]      Provider Number: [id number]  Attending Physician Name and Address:  Rama, Venetia Maxon, MD  Relative Name and Phone Number:       Current Level of Care: Hospital Recommended Level of Care: [place] Prior Approval Number:    Date Approved/Denied:   PASRR Number: [id number]  Discharge Plan: SNF    Current Diagnoses: Patient Active Problem List   Diagnosis Date Noted  . Acute metabolic encephalopathy 73/41/9379  . UTI (urinary tract infection) 04/04/2019  . Decubitus ulcer of left foot, unstageable ([place]) 04/04/2019  . Episodic tension-type headache, not intractable 01/15/2016  . Mild cognitive impairment with memory loss 07/11/2015  . Cerebrovascular disease 07/11/2015  . Memory deficits 05/15/2015  . Hereditary and idiopathic peripheral neuropathy 05/15/2015  . Cervicogenic headache 05/15/2015  . Essential hypertension 03/08/2010  . CONTACT DERMATITIS&OTHER ECZEMA DUE TO PLANTS 03/08/2010    Orientation RESPIRATION BLADDER Height & Weight     Self  Normal Incontinent Weight:   Height:     BEHAVIORAL SYMPTOMS/MOOD NEUROLOGICAL BOWEL NUTRITION STATUS      Incontinent Diet(regular)  AMBULATORY STATUS COMMUNICATION OF NEEDS Skin   Extensive Assist Verbally PU Stage and Appropriate Care   PU Stage 2 Dressing: (left and right buttocks, foam dressing changed as needed)                   Personal Care Assistance Level of Assistance  Bathing, Feeding, Dressing Bathing Assistance: Maximum assistance Feeding assistance: Limited assistance Dressing Assistance: Maximum assistance     Functional Limitations Info  Sight,  Hearing, Speech Sight Info: Adequate Hearing Info: Adequate Speech Info: Adequate    SPECIAL CARE FACTORS FREQUENCY  PT (By licensed PT), OT (By licensed OT)     PT Frequency: 5x/wk OT Frequency: 5x/wk            Contractures Contractures Info: Not present    Additional Factors Info  Code Status, Allergies Code Status Info: Full Allergies Info: Milk-related Compounds, Doxycycline, Latex, Prednisone, Shellfish-derived Products           Current Medications (04/08/2019):  This is the current hospital active medication list Current Facility-Administered Medications  Medication Dose Route Frequency Provider Last Rate Last Dose  . acetaminophen (TYLENOL) tablet 650 mg  650 mg Oral Q6H PRN Eulogio Bear U, DO      . ciprofloxacin (CIPRO) IVPB 400 mg  400 mg Intravenous Q12H Romona Curls, RPH 200 mL/hr at 04/08/19 0905 400 mg at 04/08/19 0905  . donepezil (ARICEPT) tablet 10 mg  10 mg Oral QHS Vann, Jessica U, DO   10 mg at 04/07/19 2009  . enoxaparin (LOVENOX) injection 40 mg  40 mg Subcutaneous Q24H Gala Romney L, MD   40 mg at 04/07/19 2011  . furosemide (LASIX) tablet 40 mg  40 mg Oral Daily Eulogio Bear U, DO   40 mg at 04/08/19 1015  . latanoprost (XALATAN) 0.005 % ophthalmic solution 1 drop  1 drop Both Eyes QHS Vann, Jessica U, DO   1 drop at 04/07/19 2021  . memantine (NAMENDA XR) 24 hr capsule 28 mg  28 mg Oral Daily Eulogio Bear U, DO  28 mg at 04/08/19 1015  . ondansetron (ZOFRAN) tablet 4 mg  4 mg Oral Q6H PRN Elwyn Reach, MD       Or  . ondansetron (ZOFRAN) injection 4 mg  4 mg Intravenous Q6H PRN Jonelle Sidle, Mohammad L, MD      . potassium chloride SA (K-DUR) CR tablet 40 mEq  40 mEq Oral Once Amin, Ankit Chirag, MD      . tuberculin injection 5 Units  5 Units Intradermal Once Geradine Girt, DO   5 Units at 04/06/19 2023     Discharge Medications: Please see discharge summary for a list of discharge medications.  Relevant Imaging Results:  Relevant  Lab Results:   Additional Information SS#: [id number]  Geralynn Ochs, LCSW

## 2019-04-08 NOTE — Progress Notes (Signed)
Progress Note    Steven Orozco  XQJ:[id number] DOB: 1941-03-28  DOA: 04/04/2019 PCP: Lavone Orn, MD    Brief Narrative:   Chief complaint: Follow-up altered mental status  Medical records reviewed and are as summarized below:  Steven Orozco is an 78 y.o. male with a PMH of Alzheimer's dementia, hypertension, stage III CKD who was admitted 04/03/2018 with mental status changes and agitation.  In the ED, he was mildly febrile and tachypneic with a room air saturation of 93%.  Blood work showed leukocytosis and lactic acidosis.  Urinalysis was consistent with UTI and chest x-ray was negative.  Assessment/Plan:   Principal Problem:   Acute metabolic encephalopathy secondary to urinary tract infection Urine cultures ultimately grew out E. coli.  Initially treated with Rocephin but this was changed to Cipro based on sensitivity data.  Patient continues to be altered and mildly lethargic.  Despite my multiple attempts to engage with him, he refused to answer any questions.  He will need SNF placement.  Active Problems:   Hypokalemia Repleted IV.    Essential hypertension Controlled on Lasix.     Mild cognitive impairment with memory loss Continue Aricept and Namenda.    Cerebrovascular disease Does not appear to be on aspirin or other preventative therapies.    Decubitus ulcer of left foot, unstageable ([place]) Wound care per nursing.   Family Communication/Anticipated D/C date and plan/Code Status   DVT prophylaxis: Lovenox ordered. Code Status: Full Code.  Family Communication: Wife updated by telephone. Disposition Plan: Awaiting SNF. PPD to be read at 8 pm tonight then can go tomorrow to SNF.   Medical Consultants:    None.   Anti-Infectives:    Rocephin 04/04/2019---> 04/05/2019  Cipro 04/06/2019--->   Subjective:   The patient is relatively nonverbal.  Refuses to engage with me despite multiple attempts to ask him questions.  Opens his eyes only.   Objective:    Vitals:   04/07/19 1132 04/07/19 1633 04/08/19 0453 04/08/19 0855  BP: 116/83 114/83 133/82 140/78  Pulse: 82 72 64 (!) 55  Resp: 18 18 18 15   Temp: 98.2 F (36.8 C) 98.2 F (36.8 C) 98.4 F (36.9 C) 98.9 F (37.2 C)  TempSrc: Axillary Axillary Oral Oral  SpO2: 97%  97% 96%    Intake/Output Summary (Last 24 hours) at 04/08/2019 0900 Last data filed at 04/08/2019 0458 Gross per 24 hour  Intake 1216.25 ml  Output 900 ml  Net 316.25 ml    Exam: General: No acute distress. Cardiovascular: Heart sounds show a regular rate, and rhythm. No gallops or rubs. No murmurs. No JVD. Lungs: Clear to auscultation bilaterally with good air movement. No rales, rhonchi or wheezes. Abdomen: Soft, nontender, nondistended with normal active bowel sounds. No masses. No hepatosplenomegaly. Neurological: Mildly lethargic but opens eyes to voice.  Nonverbal. Skin: Warm and dry. No rashes or lesions. Extremities: No clubbing or cyanosis, the feet are discolored and a bit cool to touch. No edema. Pedal pulses 2+. Psychiatric: Mood and affect are flat. Insight and judgment are paired.   Data Reviewed:   I have personally reviewed following labs and imaging studies:  Labs: Labs show the following:   Basic Metabolic Panel: Recent Labs  Lab 04/04/19 1702 04/07/19 0344  NA 135 139  K 4.1 3.0*  CL 100 102  CO2 25 27  GLUCOSE 124* 100*  BUN 13 8  CREATININE 1.01 1.03  CALCIUM 9.2 9.1   GFR CrCl cannot be calculated (  Unknown ideal weight.). Liver Function Tests: Recent Labs  Lab 04/04/19 1702  AST 21  ALT 15  ALKPHOS 64  BILITOT 1.2  PROT 6.8  ALBUMIN 4.1    Recent Labs  Lab 04/08/19 0333  AMMONIA 28    CBC: Recent Labs  Lab 04/04/19 1702 04/07/19 0344  WBC 14.2* 8.1  NEUTROABS 11.6*  --   HGB 13.4 14.6  HCT 40.8 42.9  MCV 93.8 91.9  PLT 143* 176   CBG: Recent Labs  Lab 04/04/19 1717  GLUCAP 106*   Thyroid function studies: Recent Labs     04/08/19 0333  TSH 2.252   Anemia work up: Recent Labs    04/08/19 0333  VITAMINB12 220   Sepsis Labs: Recent Labs  Lab 04/04/19 1702 04/07/19 0344  WBC 14.2* 8.1  LATICACIDVEN 3.6*  --     Microbiology Recent Results (from the past 240 hour(s))  Urine culture     Status: Abnormal   Collection Time: 04/04/19  5:02 PM  Result Value Ref Range Status   Specimen Description URINE, RANDOM  Final   Special Requests   Final    NONE Performed at [facility], [place] [address] [place], [place] [address]    Culture >=100,000 COLONIES/mL ESCHERICHIA COLI (A)  Final   Report Status 04/06/2019 FINAL  Final   Organism ID, Bacteria ESCHERICHIA COLI (A)  Final      Susceptibility   Escherichia coli - MIC*    AMPICILLIN >=32 RESISTANT Resistant     CEFAZOLIN >=64 RESISTANT Resistant     CEFTRIAXONE 16 INTERMEDIATE Intermediate     CIPROFLOXACIN <=0.25 SENSITIVE Sensitive     GENTAMICIN >=16 RESISTANT Resistant     IMIPENEM <=0.25 SENSITIVE Sensitive     NITROFURANTOIN <=16 SENSITIVE Sensitive     TRIMETH/SULFA <=20 SENSITIVE Sensitive     AMPICILLIN/SULBACTAM >=32 RESISTANT Resistant     PIP/TAZO 64 INTERMEDIATE Intermediate     Extended ESBL NEGATIVE Sensitive     * >=100,000 COLONIES/mL ESCHERICHIA COLI  SARS Coronavirus 2 (CEPHEID- Performed in [facility] hospital lab), Hosp Order     Status: None   Collection Time: 04/04/19  5:02 PM  Result Value Ref Range Status   SARS Coronavirus 2 NEGATIVE NEGATIVE Final    Comment: (NOTE) If result is NEGATIVE SARS-CoV-2 target nucleic acids are NOT DETECTED. The SARS-CoV-2 RNA is generally detectable in upper and lower  respiratory specimens during the acute phase of infection. The lowest  concentration of SARS-CoV-2 viral copies this assay can detect is 250  copies / mL. A negative result does not preclude SARS-CoV-2 infection  and should not be used as the sole basis for treatment or other  patient management decisions.  A  negative result may occur with  improper specimen collection / handling, submission of specimen other  than nasopharyngeal swab, presence of viral mutation(s) within the  areas targeted by this assay, and inadequate number of viral copies  (<250 copies / mL). A negative result must be combined with clinical  observations, patient history, and epidemiological information. If result is POSITIVE SARS-CoV-2 target nucleic acids are DETECTED. The SARS-CoV-2 RNA is generally detectable in upper and lower  respiratory specimens dur ing the acute phase of infection.  Positive  results are indicative of active infection with SARS-CoV-2.  Clinical  correlation with patient history and other diagnostic information is  necessary to determine patient infection status.  Positive results do  not rule out bacterial infection or co-infection  with other viruses. If result is PRESUMPTIVE POSTIVE SARS-CoV-2 nucleic acids MAY BE PRESENT.   A presumptive positive result was obtained on the submitted specimen  and confirmed on repeat testing.  While 2019 novel coronavirus  (SARS-CoV-2) nucleic acids may be present in the submitted sample  additional confirmatory testing may be necessary for epidemiological  and / or clinical management purposes  to differentiate between  SARS-CoV-2 and other Sarbecovirus currently known to infect humans.  If clinically indicated additional testing with an alternate test  methodology [phone]) is advised. The SARS-CoV-2 RNA is generally  detectable in upper and lower respiratory sp ecimens during the acute  phase of infection. The expected result is Negative. Fact Sheet for Patients:  StrictlyIdeas.no Fact Sheet for Healthcare Providers: BankingDealers.co.za This test is not yet approved or cleared by the [place] FDA and has been authorized for detection and/or diagnosis of SARS-CoV-2 by FDA under an Emergency Use  Authorization (EUA).  This EUA will remain in effect (meaning this test can be used) for the duration of the COVID-19 declaration under Section 564(b)(1) of the Act, 21 U.S.C. section 360bbb-3(b)(1), unless the authorization is terminated or revoked sooner. Performed at [facility], [place] [address], [place], [place] [address]   Blood Cultures (routine x 2)     Status: None (Preliminary result)   Collection Time: 04/04/19  5:03 PM  Result Value Ref Range Status   Specimen Description BLOOD LEFT ANTECUBITAL  Final   Special Requests   Final    BOTTLES DRAWN AEROBIC AND ANAEROBIC Blood Culture adequate volume   Culture   Final    NO GROWTH 3 DAYS Performed at [facility], [place] [address] [place], [place] [address]    Report Status PENDING  Incomplete  Blood Cultures (routine x 2)     Status: None (Preliminary result)   Collection Time: 04/04/19  5:20 PM  Result Value Ref Range Status   Specimen Description BLOOD RIGHT ANTECUBITAL  Final   Special Requests   Final    BOTTLES DRAWN AEROBIC AND ANAEROBIC Blood Culture results may not be optimal due to an inadequate volume of blood received in culture bottles   Culture   Final    NO GROWTH 3 DAYS Performed at [facility], [place] [address], [place], [place] [address]    Report Status PENDING  Incomplete    Procedures and diagnostic studies:  No results found.  Medications:   . donepezil  10 mg Oral QHS  . enoxaparin (LOVENOX) injection  40 mg Subcutaneous Q24H  . furosemide  40 mg Oral Daily  . latanoprost  1 drop Both Eyes QHS  . memantine  28 mg Oral Daily  . potassium chloride  40 mEq Oral Once  . tuberculin  5 Units Intradermal Once   Continuous Infusions: . sodium chloride 75 mL/hr at 04/07/19 2224  . ciprofloxacin Stopped (04/07/19 2113)     LOS: 4 days   Jacquelynn Cree  [facility] Pager [phone]. If unable to reach me by pager, please call my cell phone at [phone].   *Please refer to [email or web address], password [email or web address] to get updated schedule on who will round on this patient, as hospitalists switch teams weekly. If 7PM-7AM, please contact night-coverage at [email or web address], password [email or web address] for any overnight needs.  04/08/2019, 9:00 AM

## 2019-04-08 NOTE — TOC Progression Note (Signed)
Transition of Care [facility]) - Progression Note    Patient Details  Name: Steven Orozco MRN: [id number] Date of Birth: 01-30-1941  Transition of Care [facility]) CM/SW [place], [place] Phone Number: 04/08/2019, 3:31 PM  Clinical Narrative:   CSW received call earlier today from patient's wife that the financials were not working out for the patient to admit to [facility]; he makes too much money to qualify for Medicaid but he doesn't make enough to afford to private pay for memory care. Wife doesn't know what to do now. CSW contacted [facility] to confirm that the wife had information correct, and [facility] confirmed that they are financially not going to be able to admit the patient into their unit. CSW discussed SNF placement with the wife, and she is agreeable to whatever can help him. CSW explained how SNF placement would be covered under the patient's Medicare instead of him needing to qualify for Medicaid. CSW to fax out referral and inform the wife of bed offers.     Expected Discharge Plan: [place] Barriers to Discharge: Continued Medical Work up, Ship broker  Expected Discharge Plan and Services Expected Discharge Plan: [place] Choice: [place] arrangements for the past 2 months: Single Family Home                                       Social Determinants of Health (SDOH) Interventions    Readmission Risk Interventions No flowsheet data found.

## 2019-04-09 DIAGNOSIS — I679 Cerebrovascular disease, unspecified: Secondary | ICD-10-CM

## 2019-04-09 LAB — FOLATE RBC
Folate, Hemolysate: 344 ng/mL
Folate, RBC: 894 ng/mL (ref >498)
Hematocrit: 38.5 % (ref 37.5–51.0)

## 2019-04-09 LAB — CULTURE, BLOOD (ROUTINE X 2)
Culture: NO GROWTH
Culture: NO GROWTH
Special Requests: ADEQUATE

## 2019-04-09 NOTE — Progress Notes (Signed)
PROGRESS NOTE  Steven Orozco OIN:[id number] DOB: December 18, 1940 DOA: 04/04/2019 PCP: Lavone Orn, MD   LOS: 5 days   Brief Narrative / Interim history: Steven Orozco is an 78 y.o. male with a PMH of Alzheimer's dementia, hypertension, stage III CKD who was admitted 04/03/2018 with mental status changes and agitation.  In the ED, he was mildly febrile and tachypneic with a room air saturation of 93%.  Blood work showed leukocytosis and lactic acidosis.  Urinalysis was consistent with UTI and chest x-ray was negative.  Subjective: No complaints, eating breakfast.  Underlying dementia  Assessment & Plan: Principal Problem:   Acute metabolic encephalopathy Active Problems:   Essential hypertension   Mild cognitive impairment with memory loss   Cerebrovascular disease   UTI (urinary tract infection)   Decubitus ulcer of left foot, unstageable ([facility])   Principal Problem: Acute metabolic encephalopathy secondary to urinary tract infection -patient was initially placed on ceftriaxone urine cultures speciated E. coli currently on ciprofloxacin.  He continues to intermittently refuse to answer questions but appears alert and comfortable.  SNF placement pending  Active Problems: Hypokalemia -repleted  Essential hypertension -Controlled on Lasix.   Mild cognitive impairment with memory loss -Continue Aricept and Namenda.  Cerebrovascular disease -Does not appear to be on aspirin or other preventative therapies.  Decubitus ulcer of left foot, unstageable ([place]) -Wound care per nursing.  Scheduled Meds: . donepezil  10 mg Oral QHS  . enoxaparin (LOVENOX) injection  40 mg Subcutaneous Q24H  . furosemide  40 mg Oral Daily  . latanoprost  1 drop Both Eyes QHS  . memantine  28 mg Oral Daily  . potassium chloride  40 mEq Oral Once   Continuous Infusions: . ciprofloxacin Stopped (04/09/19 0924)   PRN Meds:.acetaminophen, ondansetron **OR** ondansetron (ZOFRAN) IV  DVT prophylaxis:  lovenox Code Status: Full code Family Communication: no family at bedside  Disposition Plan: home when ready   Consultants:   None   Procedures:   None   Antimicrobials:  Rocephin 04/04/2019---> 04/05/2019  Cipro 04/06/2019--->   Objective: Vitals:   04/08/19 2326 04/09/19 0253 04/09/19 0813 04/09/19 1133  BP: (!) 117/103 (!) 126/92 128/89 (!) 115/102  Pulse: 70 69 (!) 56 (!) 58  Resp: 12 10 16 16   Temp: 98.4 F (36.9 C) 98.4 F (36.9 C) 97.9 F (36.6 C) 97.6 F (36.4 C)  TempSrc: Oral Oral Oral Oral  SpO2: 97% 97% 94% 95%    Intake/Output Summary (Last 24 hours) at 04/09/2019 1341 Last data filed at 04/09/2019 1100 Gross per 24 hour  Intake 282.71 ml  Output 650 ml  Net -367.29 ml   There were no vitals filed for this visit.  Examination:  Constitutional: NAD Respiratory: CTA Cardiovascular: RRR, no edema   Data Reviewed: I have independently reviewed following labs and imaging studies   CBC: Recent Labs  Lab 04/04/19 1702 04/07/19 0344 04/08/19 0333  WBC 14.2* 8.1  --   NEUTROABS 11.6*  --   --   HGB 13.4 14.6  --   HCT 40.8 42.9 38.5  MCV 93.8 91.9  --   PLT 143* 176  --    Basic Metabolic Panel: Recent Labs  Lab 04/04/19 1702 04/07/19 0344  NA 135 139  K 4.1 3.0*  CL 100 102  CO2 25 27  GLUCOSE 124* 100*  BUN 13 8  CREATININE 1.01 1.03  CALCIUM 9.2 9.1   GFR: CrCl cannot be calculated (Unknown ideal weight.). Liver Function Tests: Recent Labs  Lab 04/04/19 1702  AST 21  ALT 15  ALKPHOS 64  BILITOT 1.2  PROT 6.8  ALBUMIN 4.1   No results for input(s): LIPASE, AMYLASE in the last 168 hours. Recent Labs  Lab 04/08/19 0333  AMMONIA 28   Coagulation Profile: No results for input(s): INR, PROTIME in the last 168 hours. Cardiac Enzymes: No results for input(s): CKTOTAL, CKMB, CKMBINDEX, TROPONINI in the last 168 hours. BNP (last 3 results) No results for input(s): PROBNP in the last 8760 hours. HbA1C: No results for  input(s): HGBA1C in the last 72 hours. CBG: Recent Labs  Lab 04/04/19 1717 04/08/19 2012  GLUCAP 106* 99   Lipid Profile: No results for input(s): CHOL, HDL, LDLCALC, TRIG, CHOLHDL, LDLDIRECT in the last 72 hours. Thyroid Function Tests: Recent Labs    04/08/19 0333  TSH 2.252   Anemia Panel: Recent Labs    04/08/19 0333  VITAMINB12 220   Urine analysis:    Component Value Date/Time   COLORURINE YELLOW 04/04/2019 1702   APPEARANCEUR CLEAR 04/04/2019 1702   LABSPEC 1.023 04/04/2019 1702   PHURINE 7.0 04/04/2019 1702   GLUCOSEU NEGATIVE 04/04/2019 1702   HGBUR NEGATIVE 04/04/2019 [place] 04/04/2019 [place] 04/04/2019 1702   PROTEINUR 30 (A) 04/04/2019 1702   NITRITE POSITIVE (A) 04/04/2019 1702   LEUKOCYTESUR NEGATIVE 04/04/2019 1702   Sepsis Labs: Invalid input(s): PROCALCITONIN, LACTICIDVEN  Recent Results (from the past 240 hour(s))  Urine culture     Status: Abnormal   Collection Time: 04/04/19  5:02 PM  Result Value Ref Range Status   Specimen Description URINE, RANDOM  Final   Special Requests   Final    NONE Performed at [facility], [address], [place], [place] [address]    Culture >=100,000 COLONIES/mL ESCHERICHIA COLI (A)  Final   Report Status 04/06/2019 FINAL  Final   Organism ID, Bacteria ESCHERICHIA COLI (A)  Final      Susceptibility   Escherichia coli - MIC*    AMPICILLIN >=32 RESISTANT Resistant     CEFAZOLIN >=64 RESISTANT Resistant     CEFTRIAXONE 16 INTERMEDIATE Intermediate     CIPROFLOXACIN <=0.25 SENSITIVE Sensitive     GENTAMICIN >=16 RESISTANT Resistant     IMIPENEM <=0.25 SENSITIVE Sensitive     NITROFURANTOIN <=16 SENSITIVE Sensitive     TRIMETH/SULFA <=20 SENSITIVE Sensitive     AMPICILLIN/SULBACTAM >=32 RESISTANT Resistant     PIP/TAZO 64 INTERMEDIATE Intermediate     Extended ESBL NEGATIVE Sensitive     * >=100,000 COLONIES/mL ESCHERICHIA COLI  SARS Coronavirus 2 (CEPHEID-  Performed in [place] hospital lab), Hosp Order     Status: None   Collection Time: 04/04/19  5:02 PM  Result Value Ref Range Status   SARS Coronavirus 2 NEGATIVE NEGATIVE Final    Comment: (NOTE) If result is NEGATIVE SARS-CoV-2 target nucleic acids are NOT DETECTED. The SARS-CoV-2 RNA is generally detectable in upper and lower  respiratory specimens during the acute phase of infection. The lowest  concentration of SARS-CoV-2 viral copies this assay can detect is 250  copies / mL. A negative result does not preclude SARS-CoV-2 infection  and should not be used as the sole basis for treatment or other  patient management decisions.  A negative result may occur with  improper specimen collection / handling, submission of specimen other  than nasopharyngeal swab, presence of viral mutation(s) within the  areas targeted by this assay, and inadequate  number of viral copies  (<250 copies / mL). A negative result must be combined with clinical  observations, patient history, and epidemiological information. If result is POSITIVE SARS-CoV-2 target nucleic acids are DETECTED. The SARS-CoV-2 RNA is generally detectable in upper and lower  respiratory specimens dur ing the acute phase of infection.  Positive  results are indicative of active infection with SARS-CoV-2.  Clinical  correlation with patient history and other diagnostic information is  necessary to determine patient infection status.  Positive results do  not rule out bacterial infection or co-infection with other viruses. If result is PRESUMPTIVE POSTIVE SARS-CoV-2 nucleic acids MAY BE PRESENT.   A presumptive positive result was obtained on the submitted specimen  and confirmed on repeat testing.  While 2019 novel coronavirus  (SARS-CoV-2) nucleic acids may be present in the submitted sample  additional confirmatory testing may be necessary for epidemiological  and / or clinical management purposes  to differentiate between   SARS-CoV-2 and other Sarbecovirus currently known to infect humans.  If clinically indicated additional testing with an alternate test  methodology [id number] is advised. The SARS-CoV-2 RNA is generally  detectable in upper and lower respiratory sp ecimens during the acute  phase of infection. The expected result is Negative. Fact Sheet for Patients:  StrictlyIdeas.no Fact Sheet for Healthcare Providers: BankingDealers.co.za This test is not yet approved or cleared by the [place] FDA and has been authorized for detection and/or diagnosis of SARS-CoV-2 by FDA under an Emergency Use Authorization (EUA).  This EUA will remain in effect (meaning this test can be used) for the duration of the COVID-19 declaration under Section 564(b)(1) of the Act, 21 U.S.C. section 360bbb-3(b)(1), unless the authorization is terminated or revoked sooner. Performed at [facility], [place] [address], [place], [place] [address]   Blood Cultures (routine x 2)     Status: None   Collection Time: 04/04/19  5:03 PM  Result Value Ref Range Status   Specimen Description BLOOD LEFT ANTECUBITAL  Final   Special Requests   Final    BOTTLES DRAWN AEROBIC AND ANAEROBIC Blood Culture adequate volume   Culture   Final    NO GROWTH 5 DAYS Performed at [facility], [place] [address], [place], [place] [address]    Report Status 04/09/2019 FINAL  Final  Blood Cultures (routine x 2)     Status: None   Collection Time: 04/04/19  5:20 PM  Result Value Ref Range Status   Specimen Description BLOOD RIGHT ANTECUBITAL  Final   Special Requests   Final    BOTTLES DRAWN AEROBIC AND ANAEROBIC Blood Culture results may not be optimal due to an inadequate volume of blood received in culture bottles   Culture   Final    NO GROWTH 5 DAYS Performed at [facility], [place] [address], [place], [place] [address]    Report Status 04/09/2019 FINAL  Final      Radiology  Studies: No results found.   Marzetta Board, MD, PhD [facility]  Contact via  [email or web address]  [place] P: [phone]  F: [phone]

## 2019-04-09 NOTE — Care Management Important Message (Signed)
Important Message  Patient Details  Name: Steven Orozco MRN: [id number] Date of Birth: 03/11/1941   Medicare Important Message Given:  Yes    Tamaria Dunleavy 04/09/2019, 2:10 PM

## 2019-04-10 MED ORDER — LORAZEPAM 2 MG/ML IJ SOLN
1.0000 mg | Freq: Once | INTRAMUSCULAR | Status: AC
  Administered 2019-04-10: 1 mg via INTRAVENOUS
  Filled 2019-04-10: qty 1

## 2019-04-10 NOTE — Progress Notes (Addendum)
Gave bed offers to wife. Wife had questions about SNF. CSW answered questions and spoke with the wife about short-term rehab. CSW explained that insurance would typically cover 3-4 weeks short term rehab. CSW explained that medicaid covers long term care and if the patient does not have medicaid then it would be private pay. Patient's wife indicated that she just finished the husband's medicaid application. She is working with the [employer].   Patient's wife wanted to pursue [facility] or [facility] as her facility choices.   CSW reached out to [place] [facility] she stated that the patient could not come until Monday.   CSW will continue to follow and assist with disposition.   Domenic Schwab, MSW, [place]

## 2019-04-10 NOTE — TOC Progression Note (Signed)
Transition of Care [facility]) - Progression Note    Patient Details  Name: Steven Orozco MRN: [id number] Date of Birth: [birth year]  Transition of Care [facility]) CM/SW [place], [place] Phone Number: 04/10/2019, 11:56 AM  Clinical Narrative:     CSW received a phone call from the doctor regarding questions from the patient's wife regarding placement. CSW stated that she would contact the patient's wife and have a discussion with her.   CSW contacted the patient's wife Juliann Pulse. CSW spoke with Juliann Pulse about skilled nursing options. CSW reiterated that when her husband went to SNF that it would be short-term placement. Juliann Pulse stated that she still was confused about the process. CSW explained that her husband would be able to go to rehab and his insurance cover the 20+ days at [facility]. CSW explained that the SNF facility could facilitate placement to a long term care facility. CSW explained that long term is not covered by insurance and is covered by [employer]. CSW also explained that families could private pay for long term care. Juliann Pulse stated that she cannot afford private pay and that she is finishing her husband's medicaid application. CSW explained that [facility] would be able to accept her husband at the earliest on Monday. CSW asked if the she had picked a backup facility incase if [facility] wasn't able to accept him. She stated that she did not like the other facilities and inquired about other facilities. CSW faxed the patient out to [facility], [facility], and [place] facilities. Juliann Pulse stated that if he can't get into of those facilities, [facility] would be her back up choice.   CSW stated that she would fax the patient out and call her with additional bed offers as they came in.    Expected Discharge Plan: [place] Barriers to Discharge: Continued Medical Work up, Ship broker  Expected Discharge Plan and  Services Expected Discharge Plan: [place] Choice: [place] arrangements for the past 2 months: Single Family Home                                       Social Determinants of Health (SDOH) Interventions    Readmission Risk Interventions No flowsheet data found.

## 2019-04-10 NOTE — Progress Notes (Signed)
PROGRESS NOTE  Steven Orozco WPY:[id number] DOB: Oct 20, 1941 DOA: 04/04/2019 PCP: Lavone Orn, MD   LOS: 6 days   Brief Narrative / Interim history: Steven Orozco is an 78 y.o. male with a PMH of Alzheimer's dementia, hypertension, stage III CKD who was admitted 04/03/2018 with mental status changes and agitation.  In the ED, he was mildly febrile and tachypneic with a room air saturation of 93%.  Blood work showed leukocytosis and lactic acidosis.  Urinalysis was consistent with UTI and chest x-ray was negative.  Subjective: -Somewhat agitated this morning  Assessment & Plan: Principal Problem:   Acute metabolic encephalopathy Active Problems:   Essential hypertension   Mild cognitive impairment with memory loss   Cerebrovascular disease   UTI (urinary tract infection)   Decubitus ulcer of left foot, unstageable (HCC)   Principal Problem: Acute metabolic encephalopathy secondary to urinary tract infection -patient was initially placed on ceftriaxone urine cultures speciated E. coli currently on ciprofloxacin.  He continues to intermittently refuse to answer questions but appears alert and comfortable.  SNF placement pending  Active Problems: Hypokalemia -repleted  Essential hypertension -Controlled on Lasix.   Mild cognitive impairment with memory loss -Continue Aricept and Namenda.  Cerebrovascular disease -Does not appear to be on aspirin or other preventative therapies.  Decubitus ulcer of left foot, unstageable ([place]) -Wound care per nursing.  Scheduled Meds: . donepezil  10 mg Oral QHS  . enoxaparin (LOVENOX) injection  40 mg Subcutaneous Q24H  . furosemide  40 mg Oral Daily  . latanoprost  1 drop Both Eyes QHS  . memantine  28 mg Oral Daily  . potassium chloride  40 mEq Oral Once   Continuous Infusions: . ciprofloxacin 400 mg (04/10/19 1033)   PRN Meds:.acetaminophen, ondansetron **OR** ondansetron (ZOFRAN) IV  DVT prophylaxis: lovenox Code Status:  Full code Family Communication: no family at bedside, discussed with wife over the phone Disposition Plan: home when ready   Consultants:   None   Procedures:   None   Antimicrobials:  Rocephin 04/04/2019---> 04/05/2019  Cipro 04/06/2019--->   Objective: Vitals:   04/09/19 0813 04/09/19 1133 04/09/19 1605 04/10/19 0801  BP: 128/89 (!) 115/102 107/77 131/72  Pulse: (!) 56 (!) 58 62 65  Resp: 16 16 16 16   Temp: 97.9 F (36.6 C) 97.6 F (36.4 C) 98.2 F (36.8 C) 98.2 F (36.8 C)  TempSrc: Oral Oral Oral Oral  SpO2: 94% 95% 98% 97%    Intake/Output Summary (Last 24 hours) at 04/10/2019 1134 Last data filed at 04/10/2019 1039 Gross per 24 hour  Intake 60 ml  Output -  Net 60 ml   There were no vitals filed for this visit.  Examination:  Constitutional: NAD Respiratory: CTA Cardiovascular: RRR   Data Reviewed: I have independently reviewed following labs and imaging studies   CBC: Recent Labs  Lab 04/04/19 1702 04/07/19 0344 04/08/19 0333  WBC 14.2* 8.1  --   NEUTROABS 11.6*  --   --   HGB 13.4 14.6  --   HCT 40.8 42.9 38.5  MCV 93.8 91.9  --   PLT 143* 176  --    Basic Metabolic Panel: Recent Labs  Lab 04/04/19 1702 04/07/19 0344  NA 135 139  K 4.1 3.0*  CL 100 102  CO2 25 27  GLUCOSE 124* 100*  BUN 13 8  CREATININE 1.01 1.03  CALCIUM 9.2 9.1   GFR: CrCl cannot be calculated (Unknown ideal weight.). Liver Function Tests: Recent Labs  Lab 04/04/19  1702  AST 21  ALT 15  ALKPHOS 64  BILITOT 1.2  PROT 6.8  ALBUMIN 4.1   No results for input(s): LIPASE, AMYLASE in the last 168 hours. Recent Labs  Lab 04/08/19 0333  AMMONIA 28   Coagulation Profile: No results for input(s): INR, PROTIME in the last 168 hours. Cardiac Enzymes: No results for input(s): CKTOTAL, CKMB, CKMBINDEX, TROPONINI in the last 168 hours. BNP (last 3 results) No results for input(s): PROBNP in the last 8760 hours. HbA1C: No results for input(s): HGBA1C in the  last 72 hours. CBG: Recent Labs  Lab 04/04/19 1717 04/08/19 2012  GLUCAP 106* 99   Lipid Profile: No results for input(s): CHOL, HDL, LDLCALC, TRIG, CHOLHDL, LDLDIRECT in the last 72 hours. Thyroid Function Tests: Recent Labs    04/08/19 0333  TSH 2.252   Anemia Panel: Recent Labs    04/08/19 0333  VITAMINB12 220   Urine analysis:    Component Value Date/Time   COLORURINE YELLOW 04/04/2019 1702   APPEARANCEUR CLEAR 04/04/2019 1702   LABSPEC 1.023 04/04/2019 1702   PHURINE 7.0 04/04/2019 1702   GLUCOSEU NEGATIVE 04/04/2019 1702   HGBUR NEGATIVE 04/04/2019 [place] 04/04/2019 [place] 04/04/2019 1702   PROTEINUR 30 (A) 04/04/2019 1702   NITRITE POSITIVE (A) 04/04/2019 1702   LEUKOCYTESUR NEGATIVE 04/04/2019 1702   Sepsis Labs: Invalid input(s): PROCALCITONIN, LACTICIDVEN  Recent Results (from the past 240 hour(s))  Urine culture     Status: Abnormal   Collection Time: 04/04/19  5:02 PM  Result Value Ref Range Status   Specimen Description URINE, RANDOM  Final   Special Requests   Final    NONE Performed at [facility], [address], [place], [place] [address]    Culture >=100,000 COLONIES/mL ESCHERICHIA COLI (A)  Final   Report Status 04/06/2019 FINAL  Final   Organism ID, Bacteria ESCHERICHIA COLI (A)  Final      Susceptibility   Escherichia coli - MIC*    AMPICILLIN >=32 RESISTANT Resistant     CEFAZOLIN >=64 RESISTANT Resistant     CEFTRIAXONE 16 INTERMEDIATE Intermediate     CIPROFLOXACIN <=0.25 SENSITIVE Sensitive     GENTAMICIN >=16 RESISTANT Resistant     IMIPENEM <=0.25 SENSITIVE Sensitive     NITROFURANTOIN <=16 SENSITIVE Sensitive     TRIMETH/SULFA <=20 SENSITIVE Sensitive     AMPICILLIN/SULBACTAM >=32 RESISTANT Resistant     PIP/TAZO 64 INTERMEDIATE Intermediate     Extended ESBL NEGATIVE Sensitive     * >=100,000 COLONIES/mL ESCHERICHIA COLI  SARS Coronavirus 2 (CEPHEID- Performed in [place]  hospital lab), Hosp Order     Status: None   Collection Time: 04/04/19  5:02 PM  Result Value Ref Range Status   SARS Coronavirus 2 NEGATIVE NEGATIVE Final    Comment: (NOTE) If result is NEGATIVE SARS-CoV-2 target nucleic acids are NOT DETECTED. The SARS-CoV-2 RNA is generally detectable in upper and lower  respiratory specimens during the acute phase of infection. The lowest  concentration of SARS-CoV-2 viral copies this assay can detect is 250  copies / mL. A negative result does not preclude SARS-CoV-2 infection  and should not be used as the sole basis for treatment or other  patient management decisions.  A negative result may occur with  improper specimen collection / handling, submission of specimen other  than nasopharyngeal swab, presence of viral mutation(s) within the  areas targeted by this assay, and inadequate number of  viral copies  (<250 copies / mL). A negative result must be combined with clinical  observations, patient history, and epidemiological information. If result is POSITIVE SARS-CoV-2 target nucleic acids are DETECTED. The SARS-CoV-2 RNA is generally detectable in upper and lower  respiratory specimens dur ing the acute phase of infection.  Positive  results are indicative of active infection with SARS-CoV-2.  Clinical  correlation with patient history and other diagnostic information is  necessary to determine patient infection status.  Positive results do  not rule out bacterial infection or co-infection with other viruses. If result is PRESUMPTIVE POSTIVE SARS-CoV-2 nucleic acids MAY BE PRESENT.   A presumptive positive result was obtained on the submitted specimen  and confirmed on repeat testing.  While 2019 novel coronavirus  (SARS-CoV-2) nucleic acids may be present in the submitted sample  additional confirmatory testing may be necessary for epidemiological  and / or clinical management purposes  to differentiate between  SARS-CoV-2 and other  Sarbecovirus currently known to infect humans.  If clinically indicated additional testing with an alternate test  methodology [phone]) is advised. The SARS-CoV-2 RNA is generally  detectable in upper and lower respiratory sp ecimens during the acute  phase of infection. The expected result is Negative. Fact Sheet for Patients:  StrictlyIdeas.no Fact Sheet for Healthcare Providers: BankingDealers.co.za This test is not yet approved or cleared by the [place] FDA and has been authorized for detection and/or diagnosis of SARS-CoV-2 by FDA under an Emergency Use Authorization (EUA).  This EUA will remain in effect (meaning this test can be used) for the duration of the COVID-19 declaration under Section 564(b)(1) of the Act, 21 U.S.C. section 360bbb-3(b)(1), unless the authorization is terminated or revoked sooner. Performed at [facility], [place] [address], [place], [place] [address]   Blood Cultures (routine x 2)     Status: None   Collection Time: 04/04/19  5:03 PM  Result Value Ref Range Status   Specimen Description BLOOD LEFT ANTECUBITAL  Final   Special Requests   Final    BOTTLES DRAWN AEROBIC AND ANAEROBIC Blood Culture adequate volume   Culture   Final    NO GROWTH 5 DAYS Performed at [facility], [place] [address] [place], [place] [address]    Report Status 04/09/2019 FINAL  Final  Blood Cultures (routine x 2)     Status: None   Collection Time: 04/04/19  5:20 PM  Result Value Ref Range Status   Specimen Description BLOOD RIGHT ANTECUBITAL  Final   Special Requests   Final    BOTTLES DRAWN AEROBIC AND ANAEROBIC Blood Culture results may not be optimal due to an inadequate volume of blood received in culture bottles   Culture   Final    NO GROWTH 5 DAYS Performed at [facility], [place] [address] [place], [place] [address]    Report Status 04/09/2019 FINAL  Final      Radiology Studies: No results  found.   Marzetta Board, MD, PhD [facility]  Contact via  [email or web address]  [place] P: [phone]  F: [phone]

## 2019-04-11 LAB — CREATININE, SERUM
Creatinine, Ser: 1.07 mg/dL (ref 0.61–1.24)
GFR calc Af Amer: >60 mL/min (ref >60)
GFR calc non Af Amer: >60 mL/min (ref >60)

## 2019-04-11 MED ORDER — LORAZEPAM 2 MG/ML IJ SOLN
1.0000 mg | Freq: Once | INTRAMUSCULAR | Status: AC
  Administered 2019-04-11: 1 mg via INTRAVENOUS
  Filled 2019-04-11: qty 1

## 2019-04-11 NOTE — Progress Notes (Signed)
PROGRESS NOTE  Steven Orozco HMC:[id number] DOB: May 15, 1941 DOA: 04/04/2019 PCP: Lavone Orn, MD   LOS: 7 days   Brief Narrative / Interim history: Steven Orozco is an 78 y.o. male with a PMH of Alzheimer's dementia, hypertension, stage III CKD who was admitted 04/03/2018 with mental status changes and agitation.  In the ED, he was mildly febrile and tachypneic with a room air saturation of 93%.  Blood work showed leukocytosis and lactic acidosis.  Urinalysis was consistent with UTI and chest x-ray was negative.  Subjective: -[place] today  Assessment & Plan: Principal Problem:   Acute metabolic encephalopathy Active Problems:   Essential hypertension   Mild cognitive impairment with memory loss   Cerebrovascular disease   UTI (urinary tract infection)   Decubitus ulcer of left foot, unstageable (HCC)   Principal Problem: Acute metabolic encephalopathy secondary to urinary tract infection -patient was initially placed on ceftriaxone urine cultures speciated E. coli currently on ciprofloxacin.  He continues to intermittently refuse to answer questions but appears alert and comfortable.  SNF placement pending  Active Problems: Hypokalemia -repleted  Essential hypertension -Controlled on Lasix.   Mild cognitive impairment with memory loss -Continue Aricept and Namenda.  Cerebrovascular disease -Does not appear to be on aspirin or other preventative therapies.  Decubitus ulcer of left foot, unstageable ([place]) -Wound care per nursing.  Scheduled Meds: . donepezil  10 mg Oral QHS  . enoxaparin (LOVENOX) injection  40 mg Subcutaneous Q24H  . furosemide  40 mg Oral Daily  . latanoprost  1 drop Both Eyes QHS  . memantine  28 mg Oral Daily  . potassium chloride  40 mEq Oral Once   Continuous Infusions: . ciprofloxacin Stopped (04/11/19 1130)   PRN Meds:.acetaminophen, ondansetron **OR** ondansetron (ZOFRAN) IV  DVT prophylaxis: lovenox Code Status: Full code Family  Communication: no family at bedside, discussed with wife over the phone Disposition Plan: home when ready   Consultants:   None   Procedures:   None   Antimicrobials:  Rocephin 04/04/2019---> 04/05/2019  Cipro 04/06/2019--->   Objective: Vitals:   04/10/19 2300 04/11/19 0435 04/11/19 0730 04/11/19 1201  BP: 127/86 131/87 (!) 139/91 119/88  Pulse: 88 84 90 75  Resp:  18 18 17   Temp: 98.7 F (37.1 C)  98.5 F (36.9 C) 98.4 F (36.9 C)  TempSrc: Axillary  Axillary Axillary  SpO2: 100% 100% (!) 83% 98%    Intake/Output Summary (Last 24 hours) at 04/11/2019 1331 Last data filed at 04/11/2019 0917 Gross per 24 hour  Intake 430.07 ml  Output 950 ml  Net -519.93 ml   There were no vitals filed for this visit.  Examination:  Constitutional: NAD Respiratory: CTA Cardiovascular: RRR   Data Reviewed: I have independently reviewed following labs and imaging studies   CBC: Recent Labs  Lab 04/04/19 1702 04/07/19 0344 04/08/19 0333  WBC 14.2* 8.1  --   NEUTROABS 11.6*  --   --   HGB 13.4 14.6  --   HCT 40.8 42.9 38.5  MCV 93.8 91.9  --   PLT 143* 176  --    Basic Metabolic Panel: Recent Labs  Lab 04/04/19 1702 04/07/19 0344 04/11/19 0444  NA 135 139  --   K 4.1 3.0*  --   CL 100 102  --   CO2 25 27  --   GLUCOSE 124* 100*  --   BUN 13 8  --   CREATININE 1.01 1.03 1.07  CALCIUM 9.2 9.1  --  GFR: CrCl cannot be calculated (Unknown ideal weight.). Liver Function Tests: Recent Labs  Lab 04/04/19 1702  AST 21  ALT 15  ALKPHOS 64  BILITOT 1.2  PROT 6.8  ALBUMIN 4.1   No results for input(s): LIPASE, AMYLASE in the last 168 hours. Recent Labs  Lab 04/08/19 0333  AMMONIA 28   Coagulation Profile: No results for input(s): INR, PROTIME in the last 168 hours. Cardiac Enzymes: No results for input(s): CKTOTAL, CKMB, CKMBINDEX, TROPONINI in the last 168 hours. BNP (last 3 results) No results for input(s): PROBNP in the last 8760 hours. HbA1C: No  results for input(s): HGBA1C in the last 72 hours. CBG: Recent Labs  Lab 04/04/19 1717 04/08/19 2012  GLUCAP 106* 99   Lipid Profile: No results for input(s): CHOL, HDL, LDLCALC, TRIG, CHOLHDL, LDLDIRECT in the last 72 hours. Thyroid Function Tests: No results for input(s): TSH, T4TOTAL, FREET4, T3FREE, THYROIDAB in the last 72 hours. Anemia Panel: No results for input(s): VITAMINB12, FOLATE, FERRITIN, TIBC, IRON, RETICCTPCT in the last 72 hours. Urine analysis:    Component Value Date/Time   COLORURINE YELLOW 04/04/2019 1702   APPEARANCEUR CLEAR 04/04/2019 1702   LABSPEC 1.023 04/04/2019 1702   PHURINE 7.0 04/04/2019 1702   GLUCOSEU NEGATIVE 04/04/2019 1702   HGBUR NEGATIVE 04/04/2019 [place] 04/04/2019 [place] 04/04/2019 1702   PROTEINUR 30 (A) 04/04/2019 1702   NITRITE POSITIVE (A) 04/04/2019 1702   LEUKOCYTESUR NEGATIVE 04/04/2019 1702   Sepsis Labs: Invalid input(s): PROCALCITONIN, LACTICIDVEN  Recent Results (from the past 240 hour(s))  Urine culture     Status: Abnormal   Collection Time: 04/04/19  5:02 PM  Result Value Ref Range Status   Specimen Description URINE, RANDOM  Final   Special Requests   Final    NONE Performed at [facility], [address] [place], [place] [address]    Culture >=100,000 COLONIES/mL ESCHERICHIA COLI (A)  Final   Report Status 04/06/2019 FINAL  Final   Organism ID, Bacteria ESCHERICHIA COLI (A)  Final      Susceptibility   Escherichia coli - MIC*    AMPICILLIN >=32 RESISTANT Resistant     CEFAZOLIN >=64 RESISTANT Resistant     CEFTRIAXONE 16 INTERMEDIATE Intermediate     CIPROFLOXACIN <=0.25 SENSITIVE Sensitive     GENTAMICIN >=16 RESISTANT Resistant     IMIPENEM <=0.25 SENSITIVE Sensitive     NITROFURANTOIN <=16 SENSITIVE Sensitive     TRIMETH/SULFA <=20 SENSITIVE Sensitive     AMPICILLIN/SULBACTAM >=32 RESISTANT Resistant     PIP/TAZO 64 INTERMEDIATE Intermediate     Extended  ESBL NEGATIVE Sensitive     * >=100,000 COLONIES/mL ESCHERICHIA COLI  SARS Coronavirus 2 (CEPHEID- Performed in [place] hospital lab), Hosp Order     Status: None   Collection Time: 04/04/19  5:02 PM  Result Value Ref Range Status   SARS Coronavirus 2 NEGATIVE NEGATIVE Final    Comment: (NOTE) If result is NEGATIVE SARS-CoV-2 target nucleic acids are NOT DETECTED. The SARS-CoV-2 RNA is generally detectable in upper and lower  respiratory specimens during the acute phase of infection. The lowest  concentration of SARS-CoV-2 viral copies this assay can detect is 250  copies / mL. A negative result does not preclude SARS-CoV-2 infection  and should not be used as the sole basis for treatment or other  patient management decisions.  A negative result may occur with  improper specimen collection / handling, submission of specimen  other  than nasopharyngeal swab, presence of viral mutation(s) within the  areas targeted by this assay, and inadequate number of viral copies  (<250 copies / mL). A negative result must be combined with clinical  observations, patient history, and epidemiological information. If result is POSITIVE SARS-CoV-2 target nucleic acids are DETECTED. The SARS-CoV-2 RNA is generally detectable in upper and lower  respiratory specimens dur ing the acute phase of infection.  Positive  results are indicative of active infection with SARS-CoV-2.  Clinical  correlation with patient history and other diagnostic information is  necessary to determine patient infection status.  Positive results do  not rule out bacterial infection or co-infection with other viruses. If result is PRESUMPTIVE POSTIVE SARS-CoV-2 nucleic acids MAY BE PRESENT.   A presumptive positive result was obtained on the submitted specimen  and confirmed on repeat testing.  While 2019 novel coronavirus  (SARS-CoV-2) nucleic acids may be present in the submitted sample  additional confirmatory testing may  be necessary for epidemiological  and / or clinical management purposes  to differentiate between  SARS-CoV-2 and other Sarbecovirus currently known to infect humans.  If clinically indicated additional testing with an alternate test  methodology [phone]) is advised. The SARS-CoV-2 RNA is generally  detectable in upper and lower respiratory sp ecimens during the acute  phase of infection. The expected result is Negative. Fact Sheet for Patients:  StrictlyIdeas.no Fact Sheet for Healthcare Providers: BankingDealers.co.za This test is not yet approved or cleared by the [place] FDA and has been authorized for detection and/or diagnosis of SARS-CoV-2 by FDA under an Emergency Use Authorization (EUA).  This EUA will remain in effect (meaning this test can be used) for the duration of the COVID-19 declaration under Section 564(b)(1) of the Act, 21 U.S.C. section 360bbb-3(b)(1), unless the authorization is terminated or revoked sooner. Performed at [facility], [place] [address] [place], [place] [address]   Blood Cultures (routine x 2)     Status: None   Collection Time: 04/04/19  5:03 PM  Result Value Ref Range Status   Specimen Description BLOOD LEFT ANTECUBITAL  Final   Special Requests   Final    BOTTLES DRAWN AEROBIC AND ANAEROBIC Blood Culture adequate volume   Culture   Final    NO GROWTH 5 DAYS Performed at [facility], [place] [address] [place], [place] [address]    Report Status 04/09/2019 FINAL  Final  Blood Cultures (routine x 2)     Status: None   Collection Time: 04/04/19  5:20 PM  Result Value Ref Range Status   Specimen Description BLOOD RIGHT ANTECUBITAL  Final   Special Requests   Final    BOTTLES DRAWN AEROBIC AND ANAEROBIC Blood Culture results may not be optimal due to an inadequate volume of blood received in culture bottles   Culture   Final    NO GROWTH 5 DAYS Performed at [facility],  [place] [address] [place], [place] [address]    Report Status 04/09/2019 FINAL  Final      Radiology Studies: No results found.   Marzetta Board, MD, PhD [facility]  Contact via  [email or web address]  [place] P: [phone]  F: [phone]

## 2019-04-12 DIAGNOSIS — L89890 Pressure ulcer of other site, unstageable: Secondary | ICD-10-CM

## 2019-04-12 LAB — SARS CORONAVIRUS 2 BY RT PCR (HOSPITAL ORDER, PERFORMED IN ~~LOC~~ HOSPITAL LAB): SARS Coronavirus 2: NEGATIVE

## 2019-04-12 MED ORDER — CIPROFLOXACIN HCL 500 MG PO TABS: 500.0000 mg | ORAL_TABLET | Freq: Two times a day (BID) | ORAL | 0 refills | Status: AC

## 2019-04-12 NOTE — TOC Transition Note (Signed)
Transition of Care [facility]) - CM/SW Discharge Note   Patient Details  Name: Steven Orozco MRN: [id number] Date of Birth: Mar 27, 1941  Transition [facility] Care [facility]) CM/SW Contact:  Geralynn Ochs, LCSW Phone Number: 04/12/2019, 3:02 PM   Clinical Narrative:   Nurse to call report to [phone], Room 126A    Final next level of care: Skilled Nursing Facility Barriers to Discharge: No Barriers Identified   Patient Goals and CMS Choice Patient states their goals for this hospitalization and ongoing recovery are:: patient unable to participate in goal setting      Discharge Placement              Patient [facility] bed at: [facility] Patient to be transferred to facility by: [place] Name of family member notified: Juliann Pulse Patient and family notified of of transfer: 04/12/19  Discharge Plan and Services     Post Acute Care Choice: [place]                               Social Determinants of Health (SDOH) Interventions     Readmission Risk Interventions No flowsheet data found.

## 2019-04-12 NOTE — Progress Notes (Addendum)
Report given to [facility] center. All questions were answered.

## 2019-04-12 NOTE — Discharge Summary (Addendum)
Physician Discharge Summary  Steven Orozco IRJ:[id number] DOB: 06-12-1941 DOA: 04/04/2019  PCP: Lavone Orn, MD  Admit date: 04/04/2019 Discharge date: 04/12/2019  Admitted From: SNF Disposition:  SNF  Recommendations for Outpatient Follow-up:  1. Follow up with PCP in 1-2 weeks 2. Continue Ciprofloxacin for 1 more day  Home Health: none Equipment/Devices: none  Discharge Condition: stable CODE STATUS: Full code Diet recommendation: regular   HPI: Per admitting MD, Steven Orozco is a 78 y.o. male with medical history significant of Alzheimer's dementia, hypertension, chronic kidney disease stage III, who was brought in to the ER with worsening mental status and agitation.  Patient lives with his wife via the phone.  He was usually conversational and not agitated but today appears to have changed.  He is noted gradual decline in the last 2 days.  He was not talking and feels and appears very weak.  No falls no injury.  No sick contact.  He had recently had left foot cellulitis and was on Keflex that has improved but this appears to be coming back now.  No shortness of breath or any respiratory symptoms.  Patient was brought into the ER where he was evaluated and found to have UTI.  He is being admitted with acute metabolic encephalopathy most likely due to UTI. ED Course: Temperature is 100.9 blood pressure 120/78 pulse 95 respiratory of 30 oxygen sat 93% room air.  His white count is 14.2 platelet 143 hemoglobin 13.4.  Chemistry appears to be within normal.  Lactic acid 3.6.  Nitrite is positive with WBC 0-5 many bacteria RBC 21-50.  Chest x-ray showed no active disease.  Blood and urine cultures obtained and patient is being admitted with UTI leading to metabolic encephalopathy.  Hospital Course: Principal Problem: Acute metabolic encephalopathysecondary to urinary tract infection -patient was initially placed on ceftriaxone urine cultures speciated E. Coli, and based on  sensitivities he was narrowed to Ciprofloxacin. He completed 9 days of treatment while hospitalized, will need one additional day for a 10 day course.   Active Problems: Hypokalemia -repleted Essential hypertension -resume home medications Cognitive impairment with memory loss -Continue Aricept and Namenda. Cerebrovascular disease  Decubitus ulcer of left foot, unstageable ([place]) -Wound care per nursing.  Discharge Diagnoses:  Principal Problem:   Acute metabolic encephalopathy Active Problems:   Essential hypertension   Mild cognitive impairment with memory loss   Cerebrovascular disease   UTI (urinary tract infection)   Decubitus ulcer of left foot, unstageable ([facility])     Discharge Instructions   Allergies as of 04/12/2019      Reactions   Milk-related Compounds Itching, Swelling   Doxycycline Itching   Stomach pain   Latex Other (See Comments), Dermatitis   Blisters on hands and feet   Prednisone Other (See Comments)   cramping   Shellfish-derived Products Itching      Medication List    STOP taking these medications   Cephalexin 500 MG tablet     TAKE these medications   ciprofloxacin 500 MG tablet Commonly known as:  CIPRO Take 1 tablet (500 mg total) by mouth 2 (two) times daily for 1 day.   donepezil 10 MG tablet Commonly known as:  ARICEPT TAKE 1 TABLET BY MOUTH EVERYDAY AT BEDTIME What changed:  See the new instructions.   furosemide 40 MG tablet Commonly known as:  LASIX Take 40 mg by mouth daily.   Klor-Con M10 10 MEQ tablet Generic drug:  potassium chloride Take 10 mEq by mouth  daily.   latanoprost 0.005 % ophthalmic solution Commonly known as:  XALATAN Place 1 drop into both eyes at bedtime.   memantine 28 MG Cp24 24 hr capsule Commonly known as:  NAMENDA XR Take 28 mg by mouth daily.   PRESERVISION AREDS PO Take 1 tablet by mouth 2 (two) times a day.      Contact information for after-discharge care    Destination     [facility] Preferred SNF .   Service:  Skilled Nursing Contact information: [address] [place] [phone]              Consultations:  None   Procedures/Studies:  Dg Chest Port 1 View  Result Date: 04/04/2019 CLINICAL DATA:  78 year old male with increased weakness.  Fever. EXAM: PORTABLE CHEST 1 VIEW COMPARISON:  Chest radiographs 10/15/2013. FINDINGS: Portable AP semi upright view at 1702 hours. Lower lung volumes. Mediastinal contours remain normal. Visualized tracheal air column is within normal limits. Allowing for portable technique the lungs are clear. Negative visible bowel gas pattern. No acute osseous abnormality identified. IMPRESSION: Lower lung volumes.  No acute cardiopulmonary abnormality. Electronically Signed   By: Genevie Ann M.D.   On: 04/04/2019 17:15     Subjective: Confused, calm, no complaints   Discharge Exam: BP (!) 117/92 (BP Location: Left Arm)    Pulse 76    Temp 98.1 F (36.7 C) (Oral)    Resp 18    SpO2 94%   General: Pt is alert, awake, not in acute distress Cardiovascular: RRR, S1/S2 +, no rubs, no gallops Respiratory: CTA bilaterally, no wheezing, no rhonchi Abdominal: Soft, NT, ND, bowel sounds + Extremities: no edema, no cyanosis    The results of significant diagnostics from this hospitalization (including imaging, microbiology, ancillary and laboratory) are listed below for reference.     Microbiology: Recent Results (from the past 240 hour(s))  Urine culture     Status: Abnormal   Collection Time: 04/04/19  5:02 PM  Result Value Ref Range Status   Specimen Description URINE, RANDOM  Final   Special Requests   Final    NONE Performed at [facility], [address], [place], [place] [address]    Culture >=100,000 COLONIES/mL ESCHERICHIA COLI (A)  Final   Report Status 04/06/2019 FINAL  Final   Organism ID, Bacteria ESCHERICHIA COLI (A)  Final      Susceptibility   Escherichia  coli - MIC*    AMPICILLIN >=32 RESISTANT Resistant     CEFAZOLIN >=64 RESISTANT Resistant     CEFTRIAXONE 16 INTERMEDIATE Intermediate     CIPROFLOXACIN <=0.25 SENSITIVE Sensitive     GENTAMICIN >=16 RESISTANT Resistant     IMIPENEM <=0.25 SENSITIVE Sensitive     NITROFURANTOIN <=16 SENSITIVE Sensitive     TRIMETH/SULFA <=20 SENSITIVE Sensitive     AMPICILLIN/SULBACTAM >=32 RESISTANT Resistant     PIP/TAZO 64 INTERMEDIATE Intermediate     Extended ESBL NEGATIVE Sensitive     * >=100,000 COLONIES/mL ESCHERICHIA COLI  SARS Coronavirus 2 (CEPHEID- Performed in [facility] lab), Hosp Order     Status: None   Collection Time: 04/04/19  5:02 PM  Result Value Ref Range Status   SARS Coronavirus 2 NEGATIVE NEGATIVE Final    Comment: (NOTE) If result is NEGATIVE SARS-CoV-2 target nucleic acids are NOT DETECTED. The SARS-CoV-2 RNA is generally detectable in upper and lower  respiratory specimens during the acute phase of infection. The lowest  concentration  of SARS-CoV-2 viral copies this assay can detect is 250  copies / mL. A negative result does not preclude SARS-CoV-2 infection  and should not be used as the sole basis for treatment or other  patient management decisions.  A negative result may occur with  improper specimen collection / handling, submission of specimen other  than nasopharyngeal swab, presence of viral mutation(s) within the  areas targeted by this assay, and inadequate number of viral copies  (<250 copies / mL). A negative result must be combined with clinical  observations, patient history, and epidemiological information. If result is POSITIVE SARS-CoV-2 target nucleic acids are DETECTED. The SARS-CoV-2 RNA is generally detectable in upper and lower  respiratory specimens dur ing the acute phase of infection.  Positive  results are indicative of active infection with SARS-CoV-2.  Clinical  correlation with patient history and other diagnostic information is   necessary to determine patient infection status.  Positive results do  not rule out bacterial infection or co-infection with other viruses. If result is PRESUMPTIVE POSTIVE SARS-CoV-2 nucleic acids MAY BE PRESENT.   A presumptive positive result was obtained on the submitted specimen  and confirmed on repeat testing.  While 2019 novel coronavirus  (SARS-CoV-2) nucleic acids may be present in the submitted sample  additional confirmatory testing may be necessary for epidemiological  and / or clinical management purposes  to differentiate between  SARS-CoV-2 and other Sarbecovirus currently known to infect humans.  If clinically indicated additional testing with an alternate test  methodology [phone]) is advised. The SARS-CoV-2 RNA is generally  detectable in upper and lower respiratory sp ecimens during the acute  phase of infection. The expected result is Negative. Fact Sheet for Patients:  StrictlyIdeas.no Fact Sheet for Healthcare Providers: BankingDealers.co.za This test is not yet approved or cleared by the [place] FDA and has been authorized for detection and/or diagnosis of SARS-CoV-2 by FDA under an Emergency Use Authorization (EUA).  This EUA will remain in effect (meaning this test can be used) for the duration of the COVID-19 declaration under Section 564(b)(1) of the Act, 21 U.S.C. section 360bbb-3(b)(1), unless the authorization is terminated or revoked sooner. Performed at [facility], [place] [address] [place], [place] [address]   Blood Cultures (routine x 2)     Status: None   Collection Time: 04/04/19  5:03 PM  Result Value Ref Range Status   Specimen Description BLOOD LEFT ANTECUBITAL  Final   Special Requests   Final    BOTTLES DRAWN AEROBIC AND ANAEROBIC Blood Culture adequate volume   Culture   Final    NO GROWTH 5 DAYS Performed at [facility], [place] [address], [place], [place] [address]     Report Status 04/09/2019 FINAL  Final  Blood Cultures (routine x 2)     Status: None   Collection Time: 04/04/19  5:20 PM  Result Value Ref Range Status   Specimen Description BLOOD RIGHT ANTECUBITAL  Final   Special Requests   Final    BOTTLES DRAWN AEROBIC AND ANAEROBIC Blood Culture results may not be optimal due to an inadequate volume of blood received in culture bottles   Culture   Final    NO GROWTH 5 DAYS Performed at [facility], [place] [address], [place], [place] [address]    Report Status 04/09/2019 FINAL  Final  SARS Coronavirus 2 (CEPHEID - Performed in [facility] hospital lab), Hosp Order     Status: None  Collection Time: 04/12/19 11:18 AM  Result Value Ref Range Status   SARS Coronavirus 2 NEGATIVE NEGATIVE Final    Comment: (NOTE) If result is NEGATIVE SARS-CoV-2 target nucleic acids are NOT DETECTED. The SARS-CoV-2 RNA is generally detectable in upper and lower  respiratory specimens during the acute phase of infection. The lowest  concentration of SARS-CoV-2 viral copies this assay can detect is 250  copies / mL. A negative result does not preclude SARS-CoV-2 infection  and should not be used as the sole basis for treatment or other  patient management decisions.  A negative result may occur with  improper specimen collection / handling, submission of specimen other  than nasopharyngeal swab, presence of viral mutation(s) within the  areas targeted by this assay, and inadequate number of viral copies  (<250 copies / mL). A negative result must be combined with clinical  observations, patient history, and epidemiological information. If result is POSITIVE SARS-CoV-2 target nucleic acids are DETECTED. The SARS-CoV-2 RNA is generally detectable in upper and lower  respiratory specimens dur ing the acute phase of infection.  Positive  results are indicative of active infection with SARS-CoV-2.  Clinical  correlation with patient history and other diagnostic  information is  necessary to determine patient infection status.  Positive results do  not rule out bacterial infection or co-infection with other viruses. If result is PRESUMPTIVE POSTIVE SARS-CoV-2 nucleic acids MAY BE PRESENT.   A presumptive positive result was obtained on the submitted specimen  and confirmed on repeat testing.  While 2019 novel coronavirus  (SARS-CoV-2) nucleic acids may be present in the submitted sample  additional confirmatory testing may be necessary for epidemiological  and / or clinical management purposes  to differentiate between  SARS-CoV-2 and other Sarbecovirus currently known to infect humans.  If clinically indicated additional testing with an alternate test  methodology [phone]) is advised. The SARS-CoV-2 RNA is generally  detectable in upper and lower respiratory sp ecimens during the acute  phase of infection. The expected result is Negative. Fact Sheet for Patients:  StrictlyIdeas.no Fact Sheet for Healthcare Providers: BankingDealers.co.za This test is not yet approved or cleared by the [place] FDA and has been authorized for detection and/or diagnosis of SARS-CoV-2 by FDA under an Emergency Use Authorization (EUA).  This EUA will remain in effect (meaning this test can be used) for the duration of the COVID-19 declaration under Section 564(b)(1) of the Act, 21 U.S.C. section 360bbb-3(b)(1), unless the authorization is terminated or revoked sooner. Performed at [facility], [place] [address], [place], [place] [address]      Labs: BNP (last 3 results) No results for input(s): BNP in the last 8760 hours. Basic Metabolic Panel: Recent Labs  Lab 04/07/19 0344 04/11/19 0444  NA 139  --   K 3.0*  --   CL 102  --   CO2 27  --   GLUCOSE 100*  --   BUN 8  --   CREATININE 1.03 1.07  CALCIUM 9.1  --    Liver Function Tests: No results for input(s): AST, ALT, ALKPHOS, BILITOT, PROT,  ALBUMIN in the last 168 hours. No results for input(s): LIPASE, AMYLASE in the last 168 hours. Recent Labs  Lab 04/08/19 0333  AMMONIA 28   CBC: Recent Labs  Lab 04/07/19 0344 04/08/19 0333  WBC 8.1  --   HGB 14.6  --   HCT 42.9 38.5  MCV 91.9  --   PLT 176  --  Cardiac Enzymes: No results for input(s): CKTOTAL, CKMB, CKMBINDEX, TROPONINI in the last 168 hours. BNP: Invalid input(s): POCBNP CBG: Recent Labs  Lab 04/08/19 2012  GLUCAP 99   D-Dimer No results for input(s): DDIMER in the last 72 hours. Hgb A1c No results for input(s): HGBA1C in the last 72 hours. Lipid Profile No results for input(s): CHOL, HDL, LDLCALC, TRIG, CHOLHDL, LDLDIRECT in the last 72 hours. Thyroid function studies No results for input(s): TSH, T4TOTAL, T3FREE, THYROIDAB in the last 72 hours.  Invalid input(s): FREET3 Anemia work up No results for input(s): VITAMINB12, FOLATE, FERRITIN, TIBC, IRON, RETICCTPCT in the last 72 hours. Urinalysis    Component Value Date/Time   COLORURINE YELLOW 04/04/2019 1702   APPEARANCEUR CLEAR 04/04/2019 1702   LABSPEC 1.023 04/04/2019 1702   PHURINE 7.0 04/04/2019 1702   GLUCOSEU NEGATIVE 04/04/2019 1702   HGBUR NEGATIVE 04/04/2019 1702   [place] 04/04/2019 1702   [place] 04/04/2019 1702   PROTEINUR 30 (A) 04/04/2019 1702   NITRITE POSITIVE (A) 04/04/2019 1702   LEUKOCYTESUR NEGATIVE 04/04/2019 1702   Sepsis Labs Invalid input(s): PROCALCITONIN,  WBC,  LACTICIDVEN  FURTHER DISCHARGE INSTRUCTIONS:   Get Medicines reviewed and adjusted: Please take all your medications with you for your next visit with your Primary MD   Laboratory/radiological data: Please request your Primary MD to go over all hospital tests and procedure/radiological results at the follow up, please ask your Primary MD to get all Hospital records sent to his/her office.   In some cases, they will be blood work, cultures and biopsy results pending at  the time of your discharge. Please request that your primary care M.D. goes through all the records of your hospital data and follows up on these results.   Also Note the following: If you experience worsening of your admission symptoms, develop shortness of breath, life threatening emergency, suicidal or homicidal thoughts you must seek medical attention immediately by calling 911 or calling your MD immediately  if symptoms less severe.   You must read complete instructions/literature along with all the possible adverse reactions/side effects for all the Medicines you take and that have been prescribed to you. Take any new Medicines after you have completely understood and accpet all the possible adverse reactions/side effects.    Do not drive when taking Pain medications or sleeping medications (Benzodaizepines)   Do not take more than prescribed Pain, Sleep and Anxiety Medications. It is not advisable to combine anxiety,sleep and pain medications without talking with your primary care practitioner   Special Instructions: If you have smoked or chewed Tobacco  in the last 2 yrs please stop smoking, stop any regular Alcohol  and or any Recreational drug use.   Wear Seat belts while driving.   Please note: You were cared for by a hospitalist during your hospital stay. Once you are discharged, your primary care physician will handle any further medical issues. Please note that NO REFILLS for any discharge medications will be authorized once you are discharged, as it is imperative that you return to your primary care physician (or establish a relationship with a primary care physician if you do not have one) for your post hospital discharge needs so that they can reassess your need for medications and monitor your lab values.  Time coordinating discharge: 35 minutes  SIGNED:  Marzetta Board, MD, PhD 04/12/2019, 1:10 PM

## 2019-05-06 ENCOUNTER — Other Ambulatory Visit: Payer: Self-pay

## 2019-05-06 ENCOUNTER — Non-Acute Institutional Stay: Payer: Medicare Other | Admitting: Adult Health Nurse Practitioner

## 2019-05-06 DIAGNOSIS — Z515 Encounter for palliative care: Secondary | ICD-10-CM

## 2019-05-06 NOTE — Progress Notes (Signed)
[facility] Consult Note Telephone: [phone]  Fax: [phone]  PATIENT NAME: Steven Orozco DOB: 10-10-1941 MRN: [id number]  PRIMARY CARE PROVIDER:   Lavone Orn, MD  REFERRING PROVIDER: Martinique Blattenberger NP  RESPONSIBLE PARTY:   Wife Steven Orozco [phone]     RECOMMENDATIONS and PLAN:  1.  Alzheimer's dementia.  Patient is presently unable to walk and is pretty much chair bound.  Wife does say that he can get up in a wheelchair.  She reports that prior to going to the hospital 5/10-5/18/2020 he was able to walk with assistance and he was at home with her.  Though she reports that he would get agitated and angry when anyone would try to give him a bath or change his soiled brief.  Patient is incontinent of bowel and bladder.  Currently patient is not eating much if at all.  Staff reports that he most of the time will refuse his meals and his meds.  Facility did not have an accurate weight on him but wife does report that when he was at home his average weight was 171.  She does report that when she face times with him with the help of facility staff that he looks the thinnest he has ever been.  Albumin in the hospital on 04/04/2019 was 4.1.  Albumin taken this week at facility is 3.7.  Patient does have unstageable pressure wound to left foot being managed at facility.  Wife reports that when she does face time with him that he speaks so low that she cannot understand anything he is saying.    2.  Goals of care. Patient is under skilled days at the facility that ends tomorrow.  She wants to transition him to LTC but the facility says they do not have a bed available in LTC but they will keep him there til they find placement for him in another facility.  She is interested in hospice services.  Spoke with hospice doctor and feels like he is eligible.  Need to coordinate with Dr. Tomasa Hosteller tomorrow about possible placement at [facility] as patient is refusing to eat.  Will also coordinate with provider at facility.  I spent 45 minutes providing this consultation,  from 1:15 to 2:00. More than 50% of the time in this consultation was spent coordinating communication.   HISTORY OF PRESENT ILLNESS:  Steven Orozco is a 78 y.o. year old male with multiple medical problems including Alzheimer's dementia, HTN, CKD stage III. Palliative Care was asked to help address goals of care.   CODE STATUS: DNR  PPS: 30% HOSPICE ELIGIBILITY/DIAGNOSIS: yes/Alzheimer's dementia  PHYSICAL EXAM:   General: NAD, frail appearing, thin Cardiovascular: regular rate and rhythm Pulmonary: lung sounds difficult to hear but no abnormal breath sounds heard. Normal respiratory effort Abdomen: soft, nontender, hypoactive bowel sounds GU: no suprapubic tenderness Extremities: no edema, no joint deformities Neurological: Weakness ; patient did not waken for visit though would flinch with touch  PAST MEDICAL HISTORY:  Past Medical History:  Diagnosis Date  . Alzheimer's dementia ([place])   . Cancer ([place])   . Chronic kidney disease   . Headache   . Hypertension     SOCIAL HX:  Social History   Tobacco Use  . Smoking status: Former Research scientist (life sciences)  . Smokeless tobacco: Never Used  Substance Use Topics  . Alcohol use: Yes    Alcohol/week: 0.0 standard drinks    Comment: rare    ALLERGIES:  Allergies  Allergen Reactions  . Milk-Related Compounds Itching and Swelling  . Doxycycline Itching    Stomach pain  . Latex Other (See Comments) and Dermatitis    Blisters on hands and feet  . Prednisone Other (See Comments)    cramping  . Shellfish-Derived Products Itching     PERTINENT MEDICATIONS:  Outpatient Encounter Medications as of 05/06/2019  Medication Sig  . donepezil (ARICEPT) 10 MG tablet TAKE 1 TABLET BY MOUTH EVERYDAY AT BEDTIME (Patient taking differently: Take 10 mg by mouth at bedtime. )  . furosemide (LASIX) 40 MG tablet Take 40  mg by mouth daily.  Marland Kitchen latanoprost (XALATAN) 0.005 % ophthalmic solution Place 1 drop into both eyes at bedtime.   . memantine (NAMENDA XR) 28 MG CP24 24 hr capsule Take 28 mg by mouth daily.  . Multiple Vitamins-Minerals (PRESERVISION AREDS PO) Take 1 tablet by mouth 2 (two) times a day.   . potassium chloride (KLOR-CON M10) 10 MEQ tablet Take 10 mEq by mouth daily.   No facility-administered encounter medications on file as of 05/06/2019.       Amy Jenetta Downer, NP

## 2019-05-07 ENCOUNTER — Telehealth: Payer: Self-pay | Admitting: Adult Health Nurse Practitioner

## 2019-05-07 NOTE — Telephone Encounter (Signed)
Provider at facility had talked with wife and she wanted me to call her.  Called and left a VM detailing transitioning to LTC and hospice once his skilled days are up.  Left contact info if she had any questions or concerns. Devron Cohick K.Olena Heckle NP

## 2019-05-26 DEATH — deceased
